# Patient Record
Sex: Male | Born: 1985
Health system: Southern US, Community
[De-identification: ages and names within clinical notes are randomized; demographics above are authoritative.]

## PROBLEM LIST (undated history)

## (undated) DIAGNOSIS — R142 Eructation: Principal | ICD-10-CM

## (undated) DIAGNOSIS — A630 Anogenital (venereal) warts: Secondary | ICD-10-CM

## (undated) DIAGNOSIS — R739 Hyperglycemia, unspecified: Secondary | ICD-10-CM

## (undated) DIAGNOSIS — I10 Essential (primary) hypertension: Secondary | ICD-10-CM

## (undated) DIAGNOSIS — B20 Human immunodeficiency virus [HIV] disease: Secondary | ICD-10-CM

## (undated) HISTORY — DX: Anogenital (venereal) warts: A63.0

## (undated) HISTORY — DX: Other disorders of bilirubin metabolism: E80.6

## (undated) HISTORY — DX: Essential (primary) hypertension: I10

## (undated) HISTORY — DX: Eructation: R14.2

## (undated) HISTORY — DX: Human immunodeficiency virus (HIV) disease: B20

## (undated) HISTORY — DX: Hyperglycemia, unspecified: R73.9

## (undated) HISTORY — PX: OTHER SURGICAL HISTORY: SHX169

---

## 2009-07-10 ENCOUNTER — Emergency Department (HOSPITAL_COMMUNITY): Admission: EM | Admit: 2009-07-10 | Discharge: 2009-07-10 | Payer: Self-pay | Admitting: Emergency Medicine

## 2010-01-03 LAB — CONVERTED CEMR LAB
Creatinine, Ser: 0.9 mg/dL
Triglyceride fasting, serum: 59 mg/dL
WBC: 8.9 10*3/uL

## 2010-01-10 LAB — CONVERTED CEMR LAB
CD4 Count: 832 microliters
CD4 T Helper %: 20.3 %

## 2010-01-31 ENCOUNTER — Ambulatory Visit: Payer: Self-pay | Admitting: Adult Health

## 2010-01-31 DIAGNOSIS — B20 Human immunodeficiency virus [HIV] disease: Secondary | ICD-10-CM | POA: Insufficient documentation

## 2010-01-31 LAB — CONVERTED CEMR LAB
ALT: 13 units/L (ref 0–53)
AST: 19 units/L (ref 0–37)
Albumin: 5.7 g/dL — ABNORMAL HIGH (ref 3.5–5.2)
Alkaline Phosphatase: 89 units/L (ref 39–117)
Basophils Absolute: 0 10*3/uL (ref 0.0–0.1)
Basophils Relative: 0 % (ref 0–1)
Bilirubin Urine: NEGATIVE
Calcium: 10.3 mg/dL (ref 8.4–10.5)
Chloride: 103 meq/L (ref 96–112)
Eosinophils Absolute: 0 10*3/uL (ref 0.0–0.7)
HCV Ab: NEGATIVE
HDL: 40 mg/dL (ref >39)
HIV 1 RNA Quant: 5630 copies/mL — ABNORMAL HIGH (ref <20)
HIV-1 RNA Quant, Log: 3.75 — ABNORMAL HIGH (ref <1.30)
Hep A Total Ab: NEGATIVE
Hep B Core Total Ab: NEGATIVE
Hep B S Ab: POSITIVE — AB
Hepatitis B Surface Ag: NEGATIVE
LDL Cholesterol: 99 mg/dL (ref 0–99)
MCHC: 33 g/dL (ref 30.0–36.0)
MCV: 85.3 fL (ref 78.0–100.0)
Monocytes Relative: 5 % (ref 3–12)
Neutro Abs: 10.1 10*3/uL — ABNORMAL HIGH (ref 1.7–7.7)
Neutrophils Relative %: 80 % — ABNORMAL HIGH (ref 43–77)
Platelets: 278 10*3/uL (ref 150–400)
Potassium: 3.9 meq/L (ref 3.5–5.3)
RBC: 5.79 M/uL (ref 4.22–5.81)
Sodium: 141 meq/L (ref 135–145)
Specific Gravity, Urine: 1.024 (ref 1.005–1.030)
Total Protein: 8.2 g/dL (ref 6.0–8.3)
Urobilinogen, UA: 0.2 (ref 0.0–1.0)
WBC: 12.8 10*3/uL — ABNORMAL HIGH (ref 4.0–10.5)
pH: 6 (ref 5.0–8.0)

## 2010-02-08 ENCOUNTER — Encounter: Admission: RE | Admit: 2010-02-08 | Discharge: 2010-02-08 | Payer: Self-pay | Admitting: Internal Medicine

## 2010-02-14 ENCOUNTER — Ambulatory Visit: Payer: Self-pay | Admitting: Adult Health

## 2010-02-14 DIAGNOSIS — A630 Anogenital (venereal) warts: Secondary | ICD-10-CM

## 2010-02-27 ENCOUNTER — Ambulatory Visit (HOSPITAL_BASED_OUTPATIENT_CLINIC_OR_DEPARTMENT_OTHER): Admission: RE | Admit: 2010-02-27 | Payer: Self-pay | Admitting: Surgery

## 2010-03-08 ENCOUNTER — Ambulatory Visit (HOSPITAL_COMMUNITY)
Admission: RE | Admit: 2010-03-08 | Discharge: 2010-03-08 | Payer: Self-pay | Source: Home / Self Care | Attending: Surgery | Admitting: Surgery

## 2010-03-22 ENCOUNTER — Encounter: Payer: Self-pay | Admitting: Adult Health

## 2010-04-07 ENCOUNTER — Encounter: Payer: Self-pay | Admitting: Adult Health

## 2010-04-25 ENCOUNTER — Emergency Department (HOSPITAL_COMMUNITY)
Admission: EM | Admit: 2010-04-25 | Discharge: 2010-04-25 | Payer: Self-pay | Source: Home / Self Care | Admitting: Emergency Medicine

## 2010-04-26 LAB — RPR: RPR Ser Ql: NONREACTIVE

## 2010-05-02 NOTE — Miscellaneous (Signed)
Summary: HIPAA Restrictions  HIPAA Restrictions   Imported By: Florinda Marker 02/01/2010 09:27:49  _____________________________________________________________________  External Attachment:    Type:   Image     Comment:   External Document

## 2010-05-02 NOTE — Assessment & Plan Note (Signed)
Summary: Nurse Visit (Infectious Disease)   Infectious Disease New Patient Intake Referring MD/Agency: Dr Juline Patch  Address: [address] [place], [place] [address]  Return Appointment Date: 02/14/2010  With Physician: Traci Sermon Medical Records: Received Health Insurance / Payor: Private Does insurance cover prescriptions? Yes Our patient has been informed that medication assistance programs are available.  Our Co-ordinator will be meeting with the patient during this visit to discuss financial and medication assistance.   Do you have a Primary physician: Yes Are family members aware of patient's diagnosis?  If so, are they supportive? Sister, supportive  Medical History Medication Allergies: No    Family History Hypertension: Yes  Family Side: Maternal  Comments: 58 living Diabetes: Yes  Family Side: Paternal  Comments: 47living  Tobacco Use: never  Behavioral Health Assessment Have you ever been diagnosed with depression or mental illness? No  Do you drink alcohol? Yes Last Date of Consumption: 01/31/2010 Frequency: social  Alcohol Beverage Type(s): wine (white)  Do you use recreational drugs? No Do you feel you have a problem with drugs and/or alcohol? No    HIV Intake Information When did you first test positive for HIV? 01/03/2010 Type of test Conducted: Western Blot   Where was this test performed?  Name of Agency: [facility]   City/State: [place], [place]  [place]: [place] Was this your first time ever being tested or HIV? No   LAST negative HIV test result: 05-2009 Name of Office[facility]   Risk Factor(s) for HIV: MSM  Method of Exposure to HIV: Homosexual Intercourse-Receptive Homosexual Intercourse-Insertive Have you ever been hospitalized for any HIV-related condition? No  Have you ever been under the care of a physician for being HIV positive? No  Newly Diagnosed Patients Has a Disease Intervention  Specialist from the Health Department contacted the patient? Yes.   The patient has been informed that the [facility] Department will contact ALL newly reported cases. Health Department Contact:  [phone]   (SSN is needed for confirmation)  Health Department Contact:  [phone]            (SSN is needed for confirmation)  Person Reporting: [facility] Do you have any Non-HIV related medical conditions or other prior hospitalizations or surgeries? No  HIV Medications Information The patient is currently NOT taking any HIV medications.  Infection History  Patient has been diagnosed with the following opportunistic infections: Are there any other symptoms you need to discuss? No Do you understand the meaning of a Viral Load? No Do you understand the meaning of a CD4 count? No Lab Values Education/Handout Given Yes Medication Education/Handout Given Yes  Sexual History Are you in a current relationship? No Details: Previous realtionship 5-6 months  Are you currently sexually active? No If no, when was your last encounter? 01/31/2009 Was this protected intercourse? No When was your last unprotected sex? 01/31/2010 Safe Sex Counseling/Pamphlet Given Sexual History Comments: last 6 months sexual  intercourse with 1 male (unprotected)  Last year 2 males (unprotected)  lifetime 4 males (unprotected)   Never had sex with females .   Evaluation and Follow-Up INTAKE CHECK LIST: HIV Education, Safe Sex Counseling, Case Management Referral, HIV Material Given, Juanell Fairly Consent  Prevention For Positives: 01/31/2010   Safe sex practices discussed with patient. Condoms offered. Current Allergies: No known allergies  Immunization History:  Influenza Immunization History:    Influenza:  historical (01/10/2010)  Immunizations Administered:  Pneumonia Vaccine:  Vaccine Type: Pneumovax    Site: right deltoid    Mfr: Merck    Dose: 0.5 ml    Route: IM    Given by: Tomasita Morrow RN     Exp. Date: 07/25/2011    Lot #: [id number]    VIS given: 03/07/09 version given January 31, 2010.  PPD Results    Date of reading: 07/01/2009    Results: < 5mm    Interpretation: negative PPD given at [facility].       Medication Adherence: 01/31/2010   Adherence to medications reviewed with patient. Counseling to provide adequate adherence provided    Prevention For Positives: 01/31/2010   Safe sex practices discussed with patient. Condoms offered.        01/31/2010   Patient was screened for substance abuse and depression. Referal was made as indicated.                       Depression History:      The patient denies a depressed mood most of the day and a diminished interest in his usual daily activities.        The patient denies that he feels like life is not worth living, denies that he wishes that he were dead, and denies that he has thought about ending his life.          -  Date:  01/10/2010    CD4: 832    CD4%: 20.3    Viral Load: 9490  Date:  01/03/2010    Glucose: 91    Hemoglobin: 15.8    WBC: 8.9    Creatinine: 0.9    Cholesterol: 177    Triglycerides: 59

## 2010-05-02 NOTE — Miscellaneous (Signed)
  Clinical Lists Changes  Orders: Added new Service order of New Patient Level IV (99204) - Signed 

## 2010-05-02 NOTE — Assessment & Plan Note (Signed)
Summary: [id number] intake   CC:  new pt. to establish and lab results.  History of Present Illness: 25 y/o African-american male newly diagnosed HIV in October 2011 in for initial evaluation post-intake.  He states he  has been feeling well and in normal state of good health.  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  He otherwise voices no complaints and has no current issues to address.  Allergies (verified): No Known Drug Allergies   Preventive Screening-Counseling & Management  Alcohol-Tobacco     Alcohol drinks/day: 0     Smoking Status: never  Caffeine-Diet-Exercise     Caffeine use/day: tea occasional     Does Patient Exercise: yes     Type of exercise: gym     Exercise (avg: min/session): 30-60     Times/week: <3  Safety-Violence-Falls     Seat Belt Use: yes      Sexual History:  same sex encounters.        Drug Use:  never.        Blood Transfusions:  no.        Travel History:  no.    Comments: pt. declined condoms   Current Allergies (reviewed today): No known allergies  Family History: Family History Diabetes 1st degree relative Family History Hypertension  Social History: Occupation: Engineer, drilling Single Never Smoked Alcohol use-no Drug use-no Regular exercise-yes Sexual History:  same sex encounters Drug Use:  never Education:  Postgraduate Marital Status:  Never Married Religion:  Christian Protestant Transportation:  Contractor Residence:  Renting Living Situation:  lives with family Sexually Active:  no Sexual Orientation:  homosexual Blood Transfusions:  no Travel History:  no  Additional History Condom Use:  frequently Hx of STD:  no Tattoos:  no School Level:  graduate school  Review of Systems General:  Denies chills, fatigue, fever, loss of appetite, malaise, sleep disorder, sweats, weakness, and weight loss. Eyes:  Denies  blurring, discharge, double vision, eye irritation, eye pain, halos, itching, light sensitivity, red eye, vision loss-1 eye, and vision loss-both eyes. ENT:  Denies decreased hearing, difficulty swallowing, ear discharge, earache, hoarseness, nasal congestion, nosebleeds, postnasal drainage, ringing in ears, sinus pressure, and sore throat. CV:  Denies bluish discoloration of lips or nails, chest pain or discomfort, difficulty breathing at night, difficulty breathing while lying down, fainting, fatigue, leg cramps with exertion, lightheadness, near fainting, palpitations, shortness of breath with exertion, swelling of feet, swelling of hands, and weight gain. Resp:  Denies chest discomfort, chest pain with inspiration, cough, coughing up blood, excessive snoring, hypersomnolence, morning headaches, pleuritic, shortness of breath, sputum productive, and wheezing. GI:  Denies abdominal pain, bloody stools, change in bowel habits, constipation, dark tarry stools, diarrhea, excessive appetite, gas, hemorrhoids, indigestion, loss of appetite, nausea, vomiting, vomiting blood, and yellowish skin color; Does have large rectal "wart" which is being assessed for surgical removal.. GU:  Denies decreased libido, discharge, dysuria, erectile dysfunction, genital sores, hematuria, incontinence, nocturia, urinary frequency, and urinary hesitancy. MS:  Denies joint pain, joint redness, joint swelling, loss of strength, low back pain, mid back pain, muscle aches, muscle , cramps, muscle weakness, stiffness, and thoracic pain. Derm:  Denies changes in color of skin, changes in nail beds, dryness, excessive perspiration, flushing, hair loss, insect bite(s), itching, lesion(s), poor wound healing, and rash. Neuro:  Denies brief paralysis, difficulty with concentration, disturbances in coordination, falling down, headaches, inability to  speak, memory loss, numbness, poor balance, seizures, sensation of room spinning, tingling,  tremors, visual disturbances, and weakness. Psych:  Denies alternate hallucination ( auditory/visual), anxiety, depression, easily angered, easily tearful, irritability, mental problems, panic attacks, sense of great danger, suicidal thoughts/plans, thoughts of violence, unusual visions or sounds, and thoughts /plans of harming others; States initially had some depressive thought, but claims a strong social network and family support and feels up to the challenge.. Endo:  Denies cold intolerance, excessive hunger, excessive thirst, excessive urination, heat intolerance, polyuria, and weight change. Heme:  Denies abnormal bruising, bleeding, enlarge lymph nodes, fevers, pallor, and skin discoloration. Allergy:  Denies hives or rash, itching eyes, persistent infections, seasonal allergies, and sneezing. Exposures:  Denies HIV exposure, EBV exposure, TB exposure, exposure to sick animals, exposure to sick people, exposure to unusual animals, exposure to small children, exposure to caves/spelunking, exposure to bats, exposure to hunting/wild game, exposure to stagnant or pond water, exposure to salt water, exposure to marine animals/shellfish, animal bites, cat scratches, tick bites, eating raw eggs, eating raw chicken, eating raw fish/shellfish, blood transfusion, ingestion of well water, water vapor exposure, history of needle use/puncture, history of antibiotic use (last 2 mo.), and history of travel.  Vital Signs:  Patient profile:   25 year old male Height:      68.5 inches (173.99 cm) Weight:      149.8 pounds (68.09 kg) BMI:     22.53 Temp:     98.2 degrees F (36.78 degrees C) oral Pulse rate:   89 / minute BP sitting:   147 / 76  (right arm)  Vitals Entered By: Wendall Mola CMA Duncan Dull) (February 14, 2010 9:03 AM) CC: new pt. to establish, lab results Is Patient Diabetic? No Pain Assessment Patient in pain? no      Nutritional Status BMI of 19 -24 = normal Nutritional Status  Detail appetite "normal"  Have you ever been in a relationship where you felt threatened, hurt or afraid?No   Does patient need assistance? Functional Status Self care Ambulation Normal   Physical Exam  General:  Well-developed,well-nourished,in no acute distress; alert,appropriate and cooperative throughout examination Head:  Normocephalic and atraumatic without obvious abnormalities. No apparent alopecia or balding. Eyes:  No corneal or conjunctival inflammation noted. EOMI. Perrla. Funduscopic exam benign, without hemorrhages, exudates or papilledema. Vision grossly normal. Ears:  External ear exam shows no significant lesions or deformities.  Otoscopic examination reveals clear canals, tympanic membranes are intact bilaterally without bulging, retraction, inflammation or discharge. Hearing is grossly normal bilaterally. Nose:  External nasal examination shows no deformity or inflammation. Nasal mucosa are pink and moist without lesions or exudates. Mouth:  Oral mucosa and oropharynx without lesions or exudates.  Teeth in good repair. Neck:  No deformities, masses, or tenderness noted. Chest Wall:  No deformities, masses, tenderness or gynecomastia noted. Lungs:  Normal respiratory effort, chest expands symmetrically. Lungs are clear to auscultation, no crackles or wheezes. Heart:  Normal rate and regular rhythm. S1 and S2 normal without gallop, murmur, click, rub or other extra sounds. Abdomen:  Bowel sounds positive,abdomen soft and non-tender without masses, organomegaly or hernias noted. Rectal:  Approx. 5 cm x 1cm x 3cm flat, verrucacious lesion noted in the perianal region.  Root of lesion indeterminate due to overall size.  No friability, no bleeding or associated pain noted. Genitalia:  Testes bilaterally descended without nodularity, tenderness or masses. No scrotal masses or lesions. No penis lesions or urethral discharge. Prostate:  deferred Msk:  No deformity or scoliosis noted  of thoracic or lumbar spine.   Pulses:  R and L carotid,radial,femoral,dorsalis pedis and posterior tibial pulses are full and equal bilaterally Extremities:  No clubbing, cyanosis, edema, or deformity noted with normal full range of motion of all joints.   Neurologic:  No cranial nerve deficits noted. Station and gait are normal. Plantar reflexes are down-going bilaterally. DTRs are symmetrical throughout. Sensory, motor and coordinative functions appear intact. Skin:  Intact without suspicious lesions or rashes Cervical Nodes:  No lymphadenopathy noted Axillary Nodes:  No palpable lymphadenopathy Inguinal Nodes:  No significant adenopathy Psych:  Cognition and judgment appear intact. Alert and cooperative with normal attention span and concentration. No apparent delusions, illusions, hallucinations   Impression & Recommendations:  Problem # 1:  HIV INFECTION (ICD-042) CD4 @ 470cells/mm3 @ 27%, baseline eval in 01/11/2010 832 @ 20.3%.  HIV RNA VL  was 9, 490 on 10/112/2011 and repeat was 5,630 copies/ml.  We spoke at length (>15 minutes) regarding implications of labs and standards for ARV therapy.  We also discussed multiple research options for him to participate.  He demonstrated interest in this and a referral to research was made.  We opted for research team to determine  fitness and suitability for studies and once a decision is made  we will follow up with him initially per their protocols.  If he does not fit studies available or declines enrollment we will see him 3 months to this date, or sooner if he opts for ARV therapy without research involvement.   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  We will defer that management to them, and from our perspective, he is medically cleared for surgery.  Post-operatively after wound healing and clearance from surgery, we will  perform an anal cytology specimen collection to check for cellular atypia.  Other Orders: Hepatitis A Vaccine (Adult Dose) [phone]) Admin 1st Vaccine ([id number])   Immunization History:  Influenza Immunization History:    Influenza:  historical (01/11/2010)  Immunizations Administered:  Hepatitis A Vaccine # 1:    Vaccine Type: HepA    Site: left deltoid    Mfr: GlaxoSmithKline    Dose: 0.5 ml    Route: IM    Given by: Wendall Mola CMA ( AAMA)    Exp. Date: 03/15/2012    Lot #: [id number]    VIS given: 06/20/04 version given February 14, 2010.

## 2010-05-04 NOTE — Miscellaneous (Signed)
Summary: Vaccine Record 01/10/10  Vaccine Record 01/10/10   Imported By: Florinda Marker 03/22/2010 16:33:32  _____________________________________________________________________  External Attachment:    Type:   Image     Comment:   External Document

## 2010-05-04 NOTE — Miscellaneous (Signed)
Summary: RW update  Clinical Lists Changes  Observations: Added new observation of RWPARTICIP: Yes (04/07/2010 13:56)

## 2010-05-04 NOTE — Consult Note (Signed)
Summary: New Pt. Referral: [facility]   New Pt. Referral: [facility]   Imported By: Florinda Marker 03/22/2010 16:32:13  _____________________________________________________________________  External Attachment:    Type:   Image     Comment:   External Document

## 2010-06-13 LAB — T-HELPER CELL (CD4) - (RCID CLINIC ONLY): CD4 % Helper T Cell: 27 % — ABNORMAL LOW (ref 33–55)

## 2010-06-13 LAB — SURGICAL PCR SCREEN
MRSA, PCR: NEGATIVE
Staphylococcus aureus: NEGATIVE

## 2010-06-13 LAB — CBC
MCH: 28.5 pg (ref 26.0–34.0)
MCHC: 33.7 g/dL (ref 30.0–36.0)
Platelets: 248 10*3/uL (ref 150–400)
RBC: 5.41 MIL/uL (ref 4.22–5.81)

## 2010-06-21 LAB — POCT I-STAT, CHEM 8
BUN: 4 mg/dL — ABNORMAL LOW (ref 6–23)
Calcium, Ion: 1.05 mmol/L — ABNORMAL LOW (ref 1.12–1.32)
Chloride: 103 mEq/L (ref 96–112)
Glucose, Bld: 104 mg/dL — ABNORMAL HIGH (ref 70–99)

## 2010-06-21 LAB — URINE MICROSCOPIC-ADD ON

## 2010-06-21 LAB — URINALYSIS, ROUTINE W REFLEX MICROSCOPIC
Bilirubin Urine: NEGATIVE
Glucose, UA: NEGATIVE mg/dL
Ketones, ur: 15 mg/dL — AB
pH: 7 (ref 5.0–8.0)

## 2010-11-08 ENCOUNTER — Other Ambulatory Visit: Payer: Self-pay | Admitting: Adult Health

## 2010-11-08 ENCOUNTER — Other Ambulatory Visit: Payer: Self-pay

## 2010-11-08 ENCOUNTER — Other Ambulatory Visit: Payer: Self-pay | Admitting: Infectious Disease

## 2010-11-08 ENCOUNTER — Other Ambulatory Visit (INDEPENDENT_AMBULATORY_CARE_PROVIDER_SITE_OTHER): Payer: 59

## 2010-11-08 DIAGNOSIS — Z113 Encounter for screening for infections with a predominantly sexual mode of transmission: Secondary | ICD-10-CM

## 2010-11-08 DIAGNOSIS — Z79899 Other long term (current) drug therapy: Secondary | ICD-10-CM

## 2010-11-08 DIAGNOSIS — B20 Human immunodeficiency virus [HIV] disease: Secondary | ICD-10-CM

## 2010-11-08 LAB — COMPLETE METABOLIC PANEL WITH GFR
ALT: 14 U/L (ref 0–53)
AST: 21 U/L (ref 0–37)
Albumin: 4.8 g/dL (ref 3.5–5.2)
Alkaline Phosphatase: 75 U/L (ref 39–117)
BUN: 10 mg/dL (ref 6–23)
Potassium: 3.8 mEq/L (ref 3.5–5.3)

## 2010-11-08 LAB — URINALYSIS, ROUTINE W REFLEX MICROSCOPIC
Glucose, UA: NEGATIVE mg/dL
Hgb urine dipstick: NEGATIVE
pH: 7 (ref 5.0–8.0)

## 2010-11-08 LAB — CBC WITH DIFFERENTIAL/PLATELET
Basophils Absolute: 0 10*3/uL (ref 0.0–0.1)
Basophils Relative: 0 % (ref 0–1)
HCT: 47.2 % (ref 39.0–52.0)
MCHC: 33.3 g/dL (ref 30.0–36.0)
Monocytes Absolute: 0.5 10*3/uL (ref 0.1–1.0)
Neutro Abs: 3.2 10*3/uL (ref 1.7–7.7)
Neutrophils Relative %: 52 % (ref 43–77)
RDW: 13.6 % (ref 11.5–15.5)

## 2010-11-08 LAB — URINALYSIS, MICROSCOPIC ONLY
Casts: NONE SEEN
Crystals: NONE SEEN
Squamous Epithelial / LPF: NONE SEEN

## 2010-11-08 LAB — RPR

## 2010-11-08 LAB — LIPID PANEL: Cholesterol: 172 mg/dL (ref 0–200)

## 2010-11-09 LAB — GC/CHLAMYDIA PROBE AMP, URINE
Chlamydia, Swab/Urine, PCR: NEGATIVE
GC Probe Amp, Urine: NEGATIVE

## 2010-11-09 LAB — T-HELPER CELL (CD4) - (RCID CLINIC ONLY): CD4 % Helper T Cell: 19 % — ABNORMAL LOW (ref 33–55)

## 2010-11-10 LAB — HIV-1 RNA QUANT-NO REFLEX-BLD
HIV 1 RNA Quant: 4090 copies/mL — ABNORMAL HIGH (ref <20)
HIV-1 RNA Quant, Log: 3.61 {Log} — ABNORMAL HIGH (ref <1.30)

## 2010-11-23 ENCOUNTER — Encounter: Payer: Self-pay | Admitting: Adult Health

## 2010-11-23 ENCOUNTER — Ambulatory Visit (INDEPENDENT_AMBULATORY_CARE_PROVIDER_SITE_OTHER): Payer: 59 | Admitting: Adult Health

## 2010-11-23 VITALS — BP 149/87 | HR 81 | Temp 98.4°F | Ht 68.0 in | Wt 159.0 lb

## 2010-11-23 DIAGNOSIS — B20 Human immunodeficiency virus [HIV] disease: Secondary | ICD-10-CM

## 2010-11-23 MED ORDER — EMTRICITAB-RILPIVIR-TENOFOV DF 200-25-300 MG PO TABS: 1.0000 | ORAL_TABLET | Freq: Every day | ORAL | Status: DC

## 2010-11-23 NOTE — Progress Notes (Signed)
Subjective:    Patient ID: Derek Fuentes. Paulino Door, male    DOB: 18-Feb-1986, 25 y.o.   MRN: [id number]  HPI Mr. Uzzle returns to clinic today for routine scheduled followup. He remains off antiretroviral medications and has not started any regimen since his diagnosis. The last time. He was seen in clinic was in November 2011. He denies any major illnesses, hospitalizations, traumas or injuries. States he's been feeling well in his normal state of good health.   Review of Systems  Constitutional: Negative for fever, chills, diaphoresis, activity change, appetite change, fatigue and unexpected weight change.  HENT: Negative for hearing loss, ear pain, nosebleeds, congestion, sore throat, facial swelling, rhinorrhea, sneezing, drooling, mouth sores, trouble swallowing, neck pain, neck stiffness, dental problem, voice change, postnasal drip, sinus pressure, tinnitus and ear discharge.   Eyes: Negative for photophobia, pain, discharge, redness, itching and visual disturbance.  Respiratory: Negative for apnea, cough, choking, chest tightness, shortness of breath, wheezing and stridor.   Cardiovascular: Negative for chest pain, palpitations and leg swelling.  Gastrointestinal: Negative for nausea, vomiting, abdominal pain, diarrhea, constipation, blood in stool, abdominal distention, anal bleeding and rectal pain.  Genitourinary: Negative for dysuria, urgency, frequency, hematuria, flank pain, decreased urine volume, discharge, penile swelling, scrotal swelling, enuresis, difficulty urinating, genital sores, penile pain and testicular pain.  Musculoskeletal: Negative for myalgias, back pain, joint swelling, arthralgias and gait problem.  Skin: Negative for color change, pallor, rash and wound.  Neurological: Negative for dizziness, tremors, seizures, syncope, facial asymmetry, speech difficulty, weakness, light-headedness, numbness and headaches.  Hematological: Negative for adenopathy. Does not  bruise/bleed easily.  Psychiatric/Behavioral: Negative for suicidal ideas, hallucinations, behavioral problems, confusion, sleep disturbance, self-injury, dysphoric mood, decreased concentration and agitation. The patient is not nervous/anxious and is not hyperactive.        Objective:   Physical Exam  Constitutional: He is oriented to person, place, and time. He appears well-developed and well-nourished.  HENT:  Head: Normocephalic and atraumatic.  Right Ear: External ear normal.  Left Ear: External ear normal.  Nose: Nose normal.  Mouth/Throat: Oropharynx is clear and moist.  Eyes: Conjunctivae and EOM are normal. Pupils are equal, round, and reactive to light. Right eye exhibits no discharge. Left eye exhibits no discharge.  Neck: Normal range of motion. Neck supple. No JVD present. No tracheal deviation present. No thyromegaly present.  Cardiovascular: Normal rate, regular rhythm, normal heart sounds and intact distal pulses.   Pulmonary/Chest: Effort normal and breath sounds normal. No stridor. He has no wheezes. He has no rales. He exhibits no tenderness.  Abdominal: Soft. Bowel sounds are normal. He exhibits no distension and no mass. There is no tenderness. There is no rebound and no guarding.  Genitourinary: Rectum normal and penis normal. Guaiac negative stool. No penile tenderness.  Musculoskeletal: Normal range of motion. He exhibits no edema and no tenderness.  Lymphadenopathy:    He has no cervical adenopathy.  Neurological: He is alert and oriented to person, place, and time. No cranial nerve deficit. He exhibits normal muscle tone. Coordination normal.  Skin: Skin is warm and dry. No rash noted. No erythema. No pallor.  Psychiatric: He has a normal mood and affect. His behavior is normal. Judgment and thought content normal.          Assessment & Plan:  1. HIV. Labs obtained 11/08/2010 show a CD4 count of 450 at 19% with a viral load of 4090 copies per mL. A review of  the serial. CD4 counts, demonstrated  continual and gradual decline in CD4 percent, over the past year and one half. We discussed in detail the implications of untreated HIV, inflammatory responses, and progressive. CD4 percent, declines, and advised on beginning antiretroviral therapy at this point. He verbally concurred, and stated he was already prepared to begin treatment. After review of the various treatment. Strategies, and potential regimens available, we decided on starting a regimen, with Complera one tablet by mouth daily with minimum of 400-calorie meal. Drug regimen, side effects, potential adverse drug reactions, and toxicities were discussed in detail. Written material regarding Complera was provided. He was instructed to return to clinic in 4 weeks for labs, with a followup with a provider in 6 weeks, but should he have problems between now, and that time. He should contact the clinic sooner for evaluation.  He verbally acknowledged all information that was provided to him and agreed with plan of care.

## 2011-01-05 ENCOUNTER — Other Ambulatory Visit (INDEPENDENT_AMBULATORY_CARE_PROVIDER_SITE_OTHER): Payer: 59

## 2011-01-05 ENCOUNTER — Other Ambulatory Visit: Payer: Self-pay | Admitting: Adult Health

## 2011-01-05 DIAGNOSIS — Z79899 Other long term (current) drug therapy: Secondary | ICD-10-CM

## 2011-01-05 DIAGNOSIS — Z113 Encounter for screening for infections with a predominantly sexual mode of transmission: Secondary | ICD-10-CM

## 2011-01-05 DIAGNOSIS — B20 Human immunodeficiency virus [HIV] disease: Secondary | ICD-10-CM

## 2011-01-05 LAB — CBC WITH DIFFERENTIAL/PLATELET
Basophils Relative: 0 % (ref 0–1)
Eosinophils Absolute: 0.1 10*3/uL (ref 0.0–0.7)
HCT: 47.6 % (ref 39.0–52.0)
Hemoglobin: 15.7 g/dL (ref 13.0–17.0)
Lymphs Abs: 3.8 10*3/uL (ref 0.7–4.0)
MCH: 28.6 pg (ref 26.0–34.0)
MCHC: 33 g/dL (ref 30.0–36.0)
Monocytes Absolute: 0.5 10*3/uL (ref 0.1–1.0)
Monocytes Relative: 6 % (ref 3–12)
Neutro Abs: 3.3 10*3/uL (ref 1.7–7.7)

## 2011-01-05 LAB — LIPID PANEL
HDL: 46 mg/dL (ref >39)
LDL Cholesterol: 121 mg/dL — ABNORMAL HIGH (ref 0–99)
Total CHOL/HDL Ratio: 3.9 Ratio
VLDL: 14 mg/dL (ref 0–40)

## 2011-01-05 LAB — COMPLETE METABOLIC PANEL WITH GFR
Albumin: 5 g/dL (ref 3.5–5.2)
Alkaline Phosphatase: 85 U/L (ref 39–117)
BUN: 12 mg/dL (ref 6–23)
CO2: 24 mEq/L (ref 19–32)
GFR, Est African American: >60 mL/min (ref >60)
GFR, Est Non African American: >60 mL/min (ref >60)
Glucose, Bld: 96 mg/dL (ref 70–99)
Potassium: 4 mEq/L (ref 3.5–5.3)

## 2011-01-05 LAB — T-HELPER CELL (CD4) - (RCID CLINIC ONLY)
CD4 % Helper T Cell: 17 % — ABNORMAL LOW (ref 33–55)
CD4 T Cell Abs: 720 uL (ref 400–2700)

## 2011-01-05 LAB — RPR

## 2011-01-06 LAB — URINALYSIS, ROUTINE W REFLEX MICROSCOPIC
Hgb urine dipstick: NEGATIVE
Ketones, ur: NEGATIVE mg/dL
Leukocytes, UA: NEGATIVE
Nitrite: NEGATIVE
Protein, ur: NEGATIVE mg/dL
Urobilinogen, UA: 0.2 mg/dL (ref 0.0–1.0)
pH: 7 (ref 5.0–8.0)

## 2011-01-19 ENCOUNTER — Encounter: Payer: Self-pay | Admitting: Infectious Disease

## 2011-01-19 ENCOUNTER — Ambulatory Visit (INDEPENDENT_AMBULATORY_CARE_PROVIDER_SITE_OTHER): Payer: 59 | Admitting: Infectious Disease

## 2011-01-19 VITALS — BP 139/89 | HR 88 | Temp 98.0°F | Ht 68.0 in | Wt 161.0 lb

## 2011-01-19 DIAGNOSIS — B20 Human immunodeficiency virus [HIV] disease: Secondary | ICD-10-CM

## 2011-01-19 DIAGNOSIS — A630 Anogenital (venereal) warts: Secondary | ICD-10-CM

## 2011-01-19 DIAGNOSIS — I10 Essential (primary) hypertension: Secondary | ICD-10-CM | POA: Insufficient documentation

## 2011-01-19 NOTE — Patient Instructions (Signed)
GREAT JOB!  Come in fasting for next set of labs

## 2011-01-19 NOTE — Assessment & Plan Note (Signed)
Superb control. 

## 2011-01-19 NOTE — Assessment & Plan Note (Signed)
Like to have him obtain a home blood pressure cuff to monitor this at home. I would consider also checking a microalbumin to creatinine ratio his next visit.

## 2011-01-19 NOTE — Progress Notes (Signed)
  Subjective:    Patient ID: Derek Fuentes, male    DOB: [birth year], 25 y.o.   MRN: [id number]  HPI  Derek Fuentes is a 25 y.o. male who is doing superbly well on his new antiviral regimen, complera with undetectable viral load and health cd4 count (700). He has no specific complaints today he is doing well he has not been slightly active. We reviewed how compression be taken with meals antacid should be avoided as well as proton pump inhibitors. We will review the caloric requirements for the medication. I reviewed with him the need for high level appearance to this non-nucleoside reverse transcriptase inhibitor regimen. Reviewed need for condom use with partners. Blood pressure was slightly elevated and we recommended him getting a home blood pressure cuff.   Review of Systems  Constitutional: Negative for fever, chills, diaphoresis, activity change, appetite change, fatigue and unexpected weight change.  HENT: Negative for congestion, sore throat, rhinorrhea, sneezing, trouble swallowing and sinus pressure.   Eyes: Negative for photophobia and visual disturbance.  Respiratory: Negative for cough, chest tightness, shortness of breath, wheezing and stridor.   Cardiovascular: Negative for chest pain, palpitations and leg swelling.  Gastrointestinal: Negative for nausea, vomiting, abdominal pain, diarrhea, constipation, blood in stool, abdominal distention and anal bleeding.  Genitourinary: Negative for dysuria, hematuria, flank pain and difficulty urinating.  Musculoskeletal: Negative for myalgias, back pain, joint swelling, arthralgias and gait problem.  Skin: Negative for color change, pallor, rash and wound.  Neurological: Negative for dizziness, tremors, weakness and light-headedness.  Hematological: Negative for adenopathy. Does not bruise/bleed easily.  Psychiatric/Behavioral: Negative for behavioral problems, confusion, sleep disturbance, dysphoric mood, decreased  concentration and agitation.       Objective:   Physical Exam  Constitutional: He is oriented to person, place, and time. He appears well-developed and well-nourished. No distress.  HENT:  Head: Normocephalic and atraumatic.  Mouth/Throat: Oropharynx is clear and moist. No oropharyngeal exudate.  Eyes: Conjunctivae and EOM are normal. Pupils are equal, round, and reactive to light. No scleral icterus.  Neck: Normal range of motion. Neck supple. No JVD present.  Cardiovascular: Normal rate, regular rhythm and normal heart sounds.  Exam reveals no gallop and no friction rub.   No murmur heard. Pulmonary/Chest: Effort normal and breath sounds normal. No respiratory distress. He has no wheezes. He has no rales. He exhibits no tenderness.  Abdominal: He exhibits no distension and no mass. There is no tenderness. There is no rebound and no guarding.  Musculoskeletal: He exhibits no edema and no tenderness.  Lymphadenopathy:    He has no cervical adenopathy.  Neurological: He is alert and oriented to person, place, and time. He has normal reflexes. He exhibits normal muscle tone. Coordination normal.  Skin: Skin is warm and dry. He is not diaphoretic. No erythema. No pallor.  Psychiatric: He has a normal mood and affect. His behavior is normal. Judgment and thought content normal.          Assessment & Plan:  HIV INFECTION Superb control  CONDYLOMA ACUMINATA, PERIANAL Status post surgery by [facility]  HTN (hypertension) Like to have him obtain a home blood pressure cuff to monitor this at home. I would consider also checking a microalbumin to creatinine ratio his next visit.

## 2011-01-19 NOTE — Assessment & Plan Note (Signed)
Status post surgery by [facility]

## 2011-05-10 ENCOUNTER — Other Ambulatory Visit: Payer: 59

## 2011-05-10 DIAGNOSIS — B20 Human immunodeficiency virus [HIV] disease: Secondary | ICD-10-CM

## 2011-05-10 LAB — COMPLETE METABOLIC PANEL WITH GFR
Albumin: 5.1 g/dL (ref 3.5–5.2)
CO2: 24 mEq/L (ref 19–32)
Calcium: 10.1 mg/dL (ref 8.4–10.5)
Chloride: 103 mEq/L (ref 96–112)
GFR, Est African American: >89 mL/min
GFR, Est Non African American: >89 mL/min
Glucose, Bld: 110 mg/dL — ABNORMAL HIGH (ref 70–99)
Potassium: 3.7 mEq/L (ref 3.5–5.3)
Sodium: 139 mEq/L (ref 135–145)
Total Bilirubin: 2 mg/dL — ABNORMAL HIGH (ref 0.3–1.2)
Total Protein: 7.8 g/dL (ref 6.0–8.3)

## 2011-05-10 LAB — CBC WITH DIFFERENTIAL/PLATELET
Basophils Absolute: 0 10*3/uL (ref 0.0–0.1)
Eosinophils Relative: 1 % (ref 0–5)
HCT: 47.6 % (ref 39.0–52.0)
Lymphocytes Relative: 34 % (ref 12–46)
MCV: 87.7 fL (ref 78.0–100.0)
Monocytes Absolute: 0.5 10*3/uL (ref 0.1–1.0)
RDW: 13.5 % (ref 11.5–15.5)
WBC: 8.1 10*3/uL (ref 4.0–10.5)

## 2011-05-10 LAB — LIPID PANEL
Cholesterol: 171 mg/dL (ref 0–200)
Triglycerides: 41 mg/dL (ref <150)
VLDL: 8 mg/dL (ref 0–40)

## 2011-05-11 LAB — RPR

## 2011-05-11 LAB — T-HELPER CELL (CD4) - (RCID CLINIC ONLY)
CD4 % Helper T Cell: 20 % — ABNORMAL LOW (ref 33–55)
CD4 T Cell Abs: 600 uL (ref 400–2700)

## 2011-05-11 LAB — GC/CHLAMYDIA PROBE AMP, URINE: GC Probe Amp, Urine: NEGATIVE

## 2011-05-24 ENCOUNTER — Encounter: Payer: Self-pay | Admitting: Adult Health

## 2011-05-24 ENCOUNTER — Ambulatory Visit (INDEPENDENT_AMBULATORY_CARE_PROVIDER_SITE_OTHER): Payer: 59 | Admitting: Adult Health

## 2011-05-24 DIAGNOSIS — I10 Essential (primary) hypertension: Secondary | ICD-10-CM

## 2011-05-24 DIAGNOSIS — B20 Human immunodeficiency virus [HIV] disease: Secondary | ICD-10-CM

## 2011-05-24 DIAGNOSIS — Z23 Encounter for immunization: Secondary | ICD-10-CM

## 2011-05-24 DIAGNOSIS — J069 Acute upper respiratory infection, unspecified: Secondary | ICD-10-CM | POA: Insufficient documentation

## 2011-05-24 DIAGNOSIS — Z0000 Encounter for general adult medical examination without abnormal findings: Secondary | ICD-10-CM | POA: Insufficient documentation

## 2011-05-24 NOTE — Assessment & Plan Note (Signed)
He still claims that his blood pressure normally is between 130/80, and 130/70. We encouraged him to keep a log and show [place]. These values as are recent assessments indicate that he has sustained elevation in blood pressure. We will readdress this again on his next visit.

## 2011-05-24 NOTE — Progress Notes (Signed)
Subjective:    Patient ID: Derek Fuentes is a 26 y.o. male.  Chief Complaint: HIV Follow-up Visit Derek Fuentes is here for follow-up of HIV infection. He is feeling unchanged since his last visit.  He claims continued adherence to therapy with good tolerance and no complications. There are additional complaints.   Patient complains of 5 days of coryza, congestion, post nasal drip and headache.     Data Review: Diagnostic studies reviewed.  Review of Systems - General ROS: positive for  - fatigue Psychological ROS: negative Respiratory ROS: positive for - cough Cardiovascular ROS: no chest pain or dyspnea on exertion Gastrointestinal ROS: no abdominal pain, change in bowel habits, or black or bloody stools Genito-Urinary ROS: no dysuria, trouble voiding, or hematuria Musculoskeletal ROS: negative Neurological ROS: no TIA or stroke symptoms Dermatological ROS: negative  Objective:   General appearance: alert, cooperative and no distress Head: Normocephalic, without obvious abnormality, atraumatic Eyes: conjunctivae/corneas clear. PERRL, EOM's intact. Fundi benign. Ears: Slight TM, bulging bilaterally Nose: Some mucosal irritation in the nares noted Throat: lips, mucosa, and tongue normal; teeth and gums normal and Postnasal drip noted Resp: clear to auscultation bilaterally Cardio: regular rate and rhythm, S1, S2 normal, no murmur, click, rub or gallop GI: soft, non-tender; bowel sounds normal; no masses,  no organomegaly Extremities: extremities normal, atraumatic, no cyanosis or edema Skin: Skin color, texture, turgor normal. No rashes or lesions Neurologic: Alert and oriented X 3, normal strength and tone. Normal symmetric reflexes. Normal coordination and gait Psych:  No vegetative signs or delusional behaviors noted.    Laboratory:  HIV 1 RNA Quant (copies/mL)  Date Value  05/10/2011 <20   01/05/2011 <20   11/08/2010 4090*     CD4 T Cell Abs (cmm)    Date Value  05/10/2011 600   01/05/2011 720   11/08/2010 450      CD4 % Helper T Cell (%)  Date Value  05/10/2011 20*  01/05/2011 17*  11/08/2010 19*     Hep A Total Ab (no units)  Date Value  01/31/2010 NEG      Hep B S Ab (no units)  Date Value  01/31/2010 POS*     Hepatitis B Surface Ag (no units)  Date Value  01/31/2010 NEG      HCV Ab (no units)  Date Value  01/31/2010 NEG           Assessment/Plan:   URI (upper respiratory infection) Guaifenesin/Dextromethorphan15 mL every 4 hours when necessary  Ibuprofen. 600 mg every 6-8 hours when necessary , fever, and discomfort  Diphenhydramine.25-50 mg by mouth each bedtime when necessary  Cetirizine 10 mg by mouth every morning Bed rest for at least the next 2 days. Increase fluid intake. Warm packs over sinuses if needed. Proper nutrition with a least 3 meals a day. Contact clinic or go to urgent care. If symptoms worsen over the next 5 days or do not improve in the next 7 days.  HIV INFECTION Clinically stable on current regimen. Continue present management.  Counseling provided on prevention of transmission of HIV. Condoms offered:  Yes Medication adherence discussed with patient. Medication refills ordered as needed. Referrals: none Follow up visit in 6 months with labs 2 weeks prior to appointment. Patient verbally acknowledged information provided to them and agreed with plan of care.   Routine health maintenance Hep A Series never completed.  Re-start series today.  HTN (hypertension) He still claims that his blood  pressure normally is between 130/80, and 130/70. We encouraged him to keep a log and show [place]. These values as are recent assessments indicate that he has sustained elevation in blood pressure. We will readdress this again on his next visit.      Sumaiyah Markert A. Sundra Aland, MS, [facility] [phone]  05/24/2011, 10:13 AM

## 2011-05-24 NOTE — Assessment & Plan Note (Signed)
Hep A Series never completed.  Re-start series today.

## 2011-05-24 NOTE — Patient Instructions (Signed)
Guaifenesin/Dextromethorphan (Robitussin-DM or equivalent) 15 mL every 4 hours when necessary   Ibuprofen (Motrin/Advil or equivalent). 600 mg every 6-8 hours when necessary , fever, and discomfort   Diphenhydramine (Benadryl) .25-50 mg by mouth each bedtime when necessary   Cetirizine (Zyrtec) 10 mg by mouth every morning  Bed rest for at least the next 2 days. Increase fluid intake. Warm packs over sinuses if needed. Proper nutrition with a least 3 meals a day.  Contact clinic or go to urgent care. If symptoms worsen over the next 5 days or do not improve in the next 7 days.

## 2011-05-24 NOTE — Assessment & Plan Note (Addendum)
Clinically stable on current regimen. Continue present management.  Counseling provided on prevention of transmission of HIV. Condoms offered:  Yes Medication adherence discussed with patient. Medication refills ordered as needed. Referrals: none Follow up visit in 6 months with labs 2 weeks prior to appointment. Patient verbally acknowledged information provided to them and agreed with plan of care.

## 2011-05-24 NOTE — Assessment & Plan Note (Signed)
Guaifenesin/Dextromethorphan15 mL every 4 hours when necessary  Ibuprofen. 600 mg every 6-8 hours when necessary , fever, and discomfort  Diphenhydramine.25-50 mg by mouth each bedtime when necessary  Cetirizine 10 mg by mouth every morning Bed rest for at least the next 2 days. Increase fluid intake. Warm packs over sinuses if needed. Proper nutrition with a least 3 meals a day. Contact clinic or go to urgent care. If symptoms worsen over the next 5 days or do not improve in the next 7 days.  

## 2011-11-06 ENCOUNTER — Other Ambulatory Visit: Payer: Self-pay | Admitting: Licensed Clinical Social Worker

## 2011-11-06 DIAGNOSIS — B20 Human immunodeficiency virus [HIV] disease: Secondary | ICD-10-CM

## 2011-11-06 MED ORDER — EMTRICITAB-RILPIVIR-TENOFOV DF 200-25-300 MG PO TABS: 1.0000 | ORAL_TABLET | Freq: Every day | ORAL | Status: DC

## 2011-11-28 ENCOUNTER — Other Ambulatory Visit: Payer: Self-pay | Admitting: Infectious Disease

## 2011-11-28 DIAGNOSIS — B20 Human immunodeficiency virus [HIV] disease: Secondary | ICD-10-CM

## 2011-12-06 ENCOUNTER — Other Ambulatory Visit: Payer: 59

## 2011-12-06 DIAGNOSIS — B20 Human immunodeficiency virus [HIV] disease: Secondary | ICD-10-CM

## 2011-12-06 LAB — CBC WITH DIFFERENTIAL/PLATELET
Hemoglobin: 15.7 g/dL (ref 13.0–17.0)
Lymphs Abs: 3.4 10*3/uL (ref 0.7–4.0)
Monocytes Relative: 5 % (ref 3–12)
Neutro Abs: 4.8 10*3/uL (ref 1.7–7.7)
Neutrophils Relative %: 55 % (ref 43–77)
Platelets: 286 10*3/uL (ref 150–400)
RBC: 5.39 MIL/uL (ref 4.22–5.81)
WBC: 8.7 10*3/uL (ref 4.0–10.5)

## 2011-12-06 LAB — COMPREHENSIVE METABOLIC PANEL
ALT: 70 U/L — ABNORMAL HIGH (ref 0–53)
Albumin: 4.8 g/dL (ref 3.5–5.2)
Alkaline Phosphatase: 94 U/L (ref 39–117)
CO2: 22 mEq/L (ref 19–32)
Glucose, Bld: 93 mg/dL (ref 70–99)
Potassium: 4.2 mEq/L (ref 3.5–5.3)
Sodium: 141 mEq/L (ref 135–145)
Total Protein: 7.4 g/dL (ref 6.0–8.3)

## 2011-12-07 LAB — T-HELPER CELL (CD4) - (RCID CLINIC ONLY)
CD4 % Helper T Cell: 20 % — ABNORMAL LOW (ref 33–55)
CD4 T Cell Abs: 640 uL (ref 400–2700)

## 2011-12-10 LAB — HIV-1 RNA QUANT-NO REFLEX-BLD
HIV 1 RNA Quant: <20 copies/mL (ref <20)
HIV-1 RNA Quant, Log: <1.3 {Log} (ref <1.30)

## 2011-12-19 ENCOUNTER — Encounter: Payer: Self-pay | Admitting: Infectious Disease

## 2011-12-19 ENCOUNTER — Ambulatory Visit (INDEPENDENT_AMBULATORY_CARE_PROVIDER_SITE_OTHER): Payer: 59 | Admitting: Infectious Disease

## 2011-12-19 VITALS — BP 131/86 | HR 111 | Temp 98.6°F | Ht 68.0 in | Wt 159.5 lb

## 2011-12-19 DIAGNOSIS — I10 Essential (primary) hypertension: Secondary | ICD-10-CM

## 2011-12-19 DIAGNOSIS — Z113 Encounter for screening for infections with a predominantly sexual mode of transmission: Secondary | ICD-10-CM

## 2011-12-19 DIAGNOSIS — B20 Human immunodeficiency virus [HIV] disease: Secondary | ICD-10-CM

## 2011-12-19 NOTE — Progress Notes (Signed)
  Subjective:    Patient ID: Derek Fuentes, male    DOB: 1985/09/27, 26 y.o.   MRN: [id number]  HPI  Derek Fuentes is a 26 y.o. male who is doing superbly well on his new antiviral regimen, complera with undetectable viral load and health cd4 count. Been concerned about his blood pressure being elevated. At home his blood pressure systolics have run in the 130s at times. I recommended we recheck a microalbumin to creatinine ratio today and that he continue diet and exercise. It is micrograms creatinine is elevated we will institute an ACE inhibitor. He was flu vaccine as part of his employer mandated vaccination.   Review of Systems  Constitutional: Negative for fever, chills, diaphoresis, activity change, appetite change, fatigue and unexpected weight change.  HENT: Negative for congestion, sore throat, rhinorrhea, sneezing, trouble swallowing and sinus pressure.   Eyes: Negative for photophobia and visual disturbance.  Respiratory: Negative for cough, chest tightness, shortness of breath, wheezing and stridor.   Cardiovascular: Negative for chest pain, palpitations and leg swelling.  Gastrointestinal: Negative for nausea, vomiting, abdominal pain, diarrhea, constipation, blood in stool, abdominal distention and anal bleeding.  Genitourinary: Negative for dysuria, hematuria, flank pain and difficulty urinating.  Musculoskeletal: Negative for myalgias, back pain, joint swelling, arthralgias and gait problem.  Skin: Negative for color change, pallor, rash and wound.  Neurological: Negative for dizziness, tremors, weakness and light-headedness.  Hematological: Negative for adenopathy. Does not bruise/bleed easily.  Psychiatric/Behavioral: Negative for behavioral problems, confusion, disturbed wake/sleep cycle, dysphoric mood, decreased concentration and agitation.       Objective:   Physical Exam  Constitutional: He is oriented to person, place, and time. He appears  well-developed and well-nourished. No distress.  HENT:  Head: Normocephalic and atraumatic.  Mouth/Throat: Oropharynx is clear and moist. No oropharyngeal exudate.  Eyes: Conjunctivae normal and EOM are normal. Pupils are equal, round, and reactive to light. No scleral icterus.  Neck: Normal range of motion. Neck supple. No JVD present.  Cardiovascular: Normal rate, regular rhythm and normal heart sounds.  Exam reveals no gallop and no friction rub.   No murmur heard. Pulmonary/Chest: Effort normal and breath sounds normal. No respiratory distress. He has no wheezes. He has no rales. He exhibits no tenderness.  Abdominal: He exhibits no distension and no mass. There is no tenderness. There is no rebound and no guarding.  Musculoskeletal: He exhibits no edema and no tenderness.  Lymphadenopathy:    He has no cervical adenopathy.  Neurological: He is alert and oriented to person, place, and time. He has normal reflexes. He exhibits normal muscle tone. Coordination normal.  Skin: Skin is warm and dry. He is not diaphoretic. No erythema. No pallor.  Psychiatric: He has a normal mood and affect. His behavior is normal. Judgment and thought content normal.          Assessment & Plan:  HIV INFECTION Continue complera perfect control  HTN (hypertension) Check microalbumin to creatinine ratio.

## 2011-12-19 NOTE — Assessment & Plan Note (Signed)
Continue complera perfect control

## 2011-12-19 NOTE — Assessment & Plan Note (Signed)
Check microalbumin to creatinine ratio.  

## 2012-02-19 ENCOUNTER — Other Ambulatory Visit: Payer: Self-pay | Admitting: Licensed Clinical Social Worker

## 2012-02-19 DIAGNOSIS — B20 Human immunodeficiency virus [HIV] disease: Secondary | ICD-10-CM

## 2012-02-19 MED ORDER — EMTRICITAB-RILPIVIR-TENOFOV DF 200-25-300 MG PO TABS: 1.0000 | ORAL_TABLET | Freq: Every day | ORAL | Status: DC

## 2012-05-13 ENCOUNTER — Other Ambulatory Visit: Payer: Self-pay | Admitting: Licensed Clinical Social Worker

## 2012-05-13 DIAGNOSIS — B20 Human immunodeficiency virus [HIV] disease: Secondary | ICD-10-CM

## 2012-05-13 MED ORDER — EMTRICITAB-RILPIVIR-TENOFOV DF 200-25-300 MG PO TABS: 1.0000 | ORAL_TABLET | Freq: Every day | ORAL | Status: DC

## 2012-06-09 ENCOUNTER — Other Ambulatory Visit (INDEPENDENT_AMBULATORY_CARE_PROVIDER_SITE_OTHER): Payer: 59

## 2012-06-09 DIAGNOSIS — B20 Human immunodeficiency virus [HIV] disease: Secondary | ICD-10-CM

## 2012-06-09 LAB — CBC WITH DIFFERENTIAL/PLATELET
Basophils Absolute: 0 K/uL (ref 0.0–0.1)
Basophils Relative: 0 % (ref 0–1)
Eosinophils Absolute: 0.1 K/uL (ref 0.0–0.7)
Eosinophils Relative: 1 % (ref 0–5)
HCT: 45.3 % (ref 39.0–52.0)
Hemoglobin: 15.6 g/dL (ref 13.0–17.0)
Lymphocytes Relative: 49 % — ABNORMAL HIGH (ref 12–46)
Lymphs Abs: 3.6 K/uL (ref 0.7–4.0)
MCH: 29.1 pg (ref 26.0–34.0)
MCHC: 34.4 g/dL (ref 30.0–36.0)
MCV: 84.4 fL (ref 78.0–100.0)
Monocytes Absolute: 0.4 K/uL (ref 0.1–1.0)
Monocytes Relative: 5 % (ref 3–12)
Neutro Abs: 3.3 K/uL (ref 1.7–7.7)
Neutrophils Relative %: 45 % (ref 43–77)
Platelets: 254 K/uL (ref 150–400)
RBC: 5.37 MIL/uL (ref 4.22–5.81)
RDW: 14 % (ref 11.5–15.5)
WBC: 7.4 K/uL (ref 4.0–10.5)

## 2012-06-10 LAB — COMPLETE METABOLIC PANEL WITH GFR
ALT: 19 U/L (ref 0–53)
AST: 25 U/L (ref 0–37)
BUN: 13 mg/dL (ref 6–23)
Creat: 1.01 mg/dL (ref 0.50–1.35)
Total Bilirubin: 1.4 mg/dL — ABNORMAL HIGH (ref 0.3–1.2)

## 2012-06-10 LAB — HIV-1 RNA QUANT-NO REFLEX-BLD
HIV 1 RNA Quant: <20 copies/mL (ref <20)
HIV-1 RNA Quant, Log: <1.3 {Log} (ref <1.30)

## 2012-06-10 LAB — T-HELPER CELL (CD4) - (RCID CLINIC ONLY): CD4 % Helper T Cell: 20 % — ABNORMAL LOW (ref 33–55)

## 2012-06-10 LAB — LIPID PANEL
Cholesterol: 159 mg/dL (ref 0–200)
HDL: 38 mg/dL — ABNORMAL LOW (ref >39)
LDL Cholesterol: 111 mg/dL — ABNORMAL HIGH (ref 0–99)
Triglycerides: 52 mg/dL (ref <150)
VLDL: 10 mg/dL (ref 0–40)

## 2012-06-10 LAB — RPR

## 2012-06-23 ENCOUNTER — Encounter: Payer: Self-pay | Admitting: Infectious Disease

## 2012-06-23 ENCOUNTER — Ambulatory Visit (INDEPENDENT_AMBULATORY_CARE_PROVIDER_SITE_OTHER): Payer: 59 | Admitting: Infectious Disease

## 2012-06-23 VITALS — BP 146/86 | HR 102 | Temp 97.7°F | Ht 68.0 in | Wt 165.0 lb

## 2012-06-23 DIAGNOSIS — I10 Essential (primary) hypertension: Secondary | ICD-10-CM

## 2012-06-23 DIAGNOSIS — B20 Human immunodeficiency virus [HIV] disease: Secondary | ICD-10-CM

## 2012-06-23 LAB — MICROALBUMIN / CREATININE URINE RATIO: Microalb Creat Ratio: 5 mg/g (ref 0.0–30.0)

## 2012-06-23 NOTE — Progress Notes (Signed)
  Subjective:    Patient ID: Derek Fuentes, male    DOB: 03-12-1986, 27 y.o.   MRN: [id number]  HPI   Derek Fuentes is a 27 y.o. male who is doing superbly well on hisantiviral regimen, complera with undetectable viral load and health cd4 count.  Again we reviewed concerns about his blood pressure being elevated. He is checking bp at home but I have asked him to also keep a log of bp for [place]   Review of Systems  Constitutional: Negative for fever, chills, diaphoresis, activity change, appetite change, fatigue and unexpected weight change.  HENT: Negative for congestion, sore throat, rhinorrhea, sneezing, trouble swallowing and sinus pressure.   Eyes: Negative for photophobia and visual disturbance.  Respiratory: Negative for cough, chest tightness, shortness of breath, wheezing and stridor.   Cardiovascular: Negative for chest pain, palpitations and leg swelling.  Gastrointestinal: Negative for nausea, vomiting, abdominal pain, diarrhea, constipation, blood in stool, abdominal distention and anal bleeding.  Genitourinary: Negative for dysuria, hematuria, flank pain and difficulty urinating.  Musculoskeletal: Negative for myalgias, back pain, joint swelling, arthralgias and gait problem.  Skin: Negative for color change, pallor, rash and wound.  Neurological: Negative for dizziness, tremors, weakness and light-headedness.  Hematological: Negative for adenopathy. Does not bruise/bleed easily.  Psychiatric/Behavioral: Negative for behavioral problems, confusion, sleep disturbance, dysphoric mood, decreased concentration and agitation.       Objective:   Physical Exam  Constitutional: He is oriented to person, place, and time. He appears well-developed and well-nourished. No distress.  HENT:  Head: Normocephalic and atraumatic.  Mouth/Throat: Oropharynx is clear and moist. No oropharyngeal exudate.  Eyes: Conjunctivae and EOM are normal. Pupils are equal, round, and reactive  to light. No scleral icterus.  Neck: Normal range of motion. Neck supple. No JVD present.  Cardiovascular: Normal rate, regular rhythm and normal heart sounds.  Exam reveals no gallop and no friction rub.   No murmur heard. Pulmonary/Chest: Effort normal and breath sounds normal. No respiratory distress. He has no wheezes. He has no rales. He exhibits no tenderness.  Abdominal: He exhibits no distension and no mass. There is no tenderness. There is no rebound and no guarding.  Musculoskeletal: He exhibits no edema and no tenderness.  Lymphadenopathy:    He has no cervical adenopathy.  Neurological: He is alert and oriented to person, place, and time. He has normal reflexes. He exhibits normal muscle tone. Coordination normal.  Skin: Skin is warm and dry. He is not diaphoretic. No erythema. No pallor.  Psychiatric: He has a normal mood and affect. His behavior is normal. Judgment and thought content normal.          Assessment & Plan:  HIV: continue Complera  HTN: recheck microalbumin to creatinine ratio, home bp log

## 2012-07-15 IMAGING — CT CT ABD-PELV W/ CM
2 of 4 series · 10 of 36 positions shown, 17 images · IV contrast (agent unspecified)
Comparison: None.

CLINICAL DATA: Rectal mass, HIV positive

CT ABDOMEN AND PELVIS WITH CONTRAST
TECHNIQUE: Multidetector CT imaging of the abdomen and pelvis was
performed following the standard protocol during bolus
administration of intravenous contrast.
Contrast: 100 ml [id number]

[Series 3: routine abdomen · axial · 0.66mm/px · z∈[-323,-3]mm · 9 of 81 slices shown, 15 images]
[im 9/81  soft-tissue]
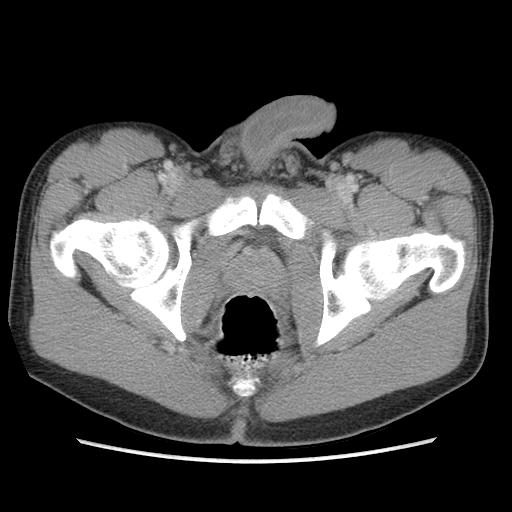
[im 9/81  bone]
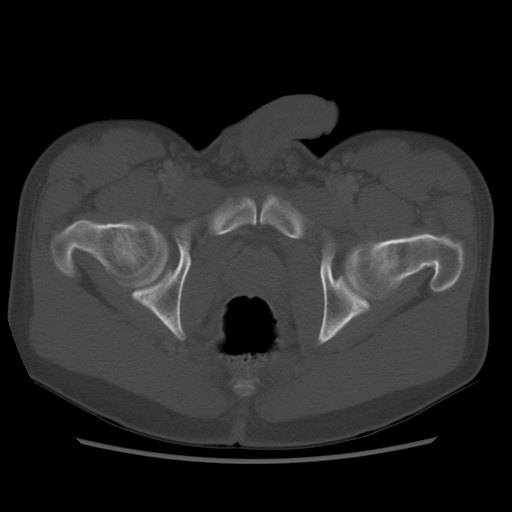
[im 17/81  soft-tissue]
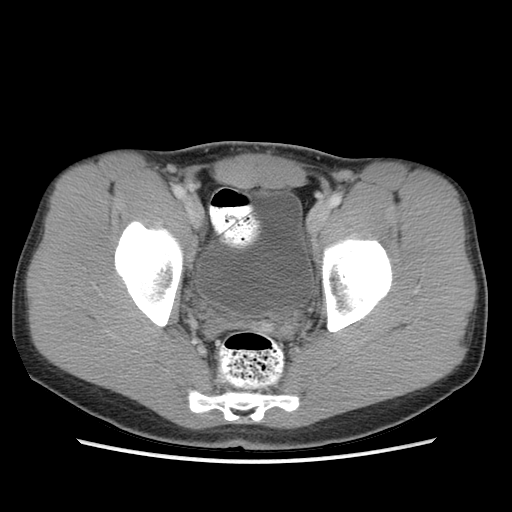
[im 25/81  soft-tissue]
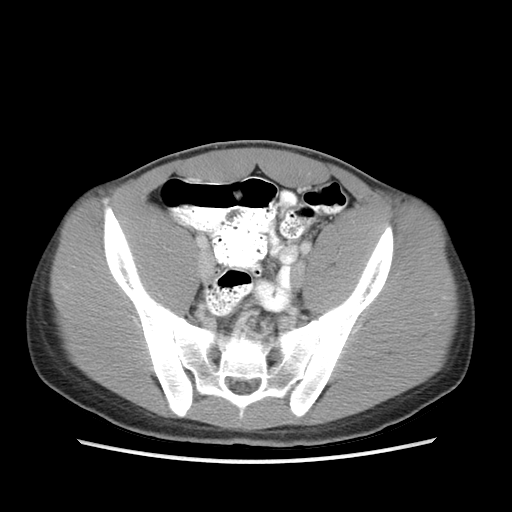
[im 33/81  soft-tissue]
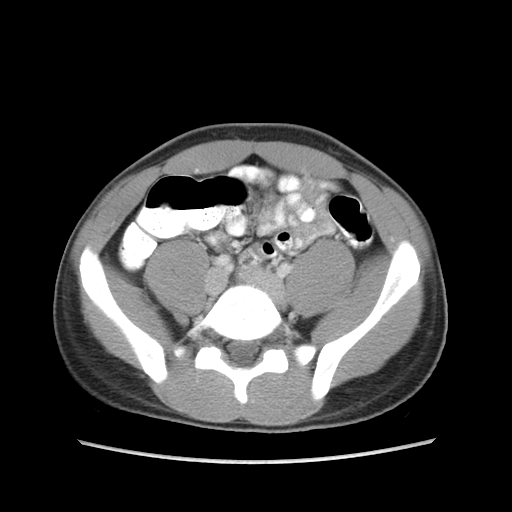
[im 41/81  soft-tissue]
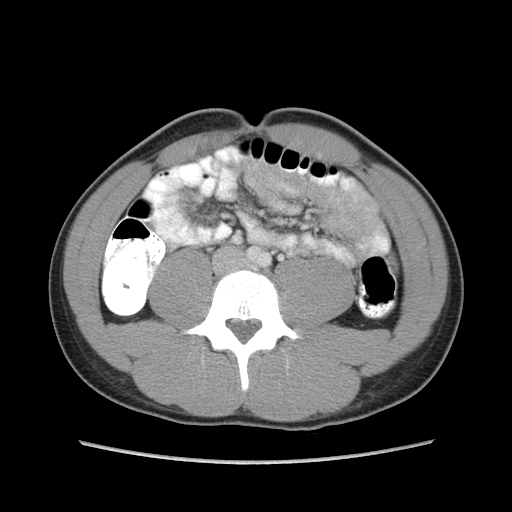
[im 49/81  soft-tissue]
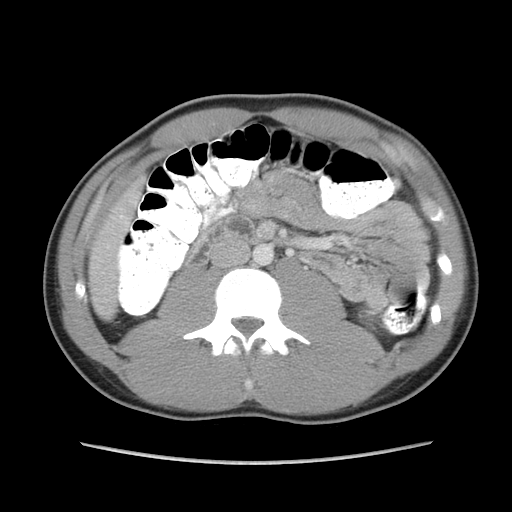
[im 49/81  lung]
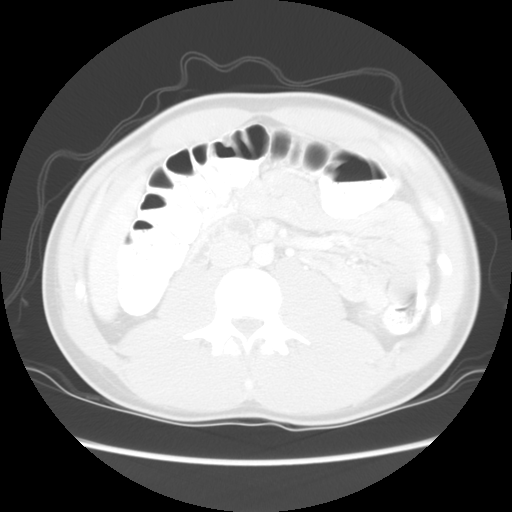
[im 57/81  soft-tissue]
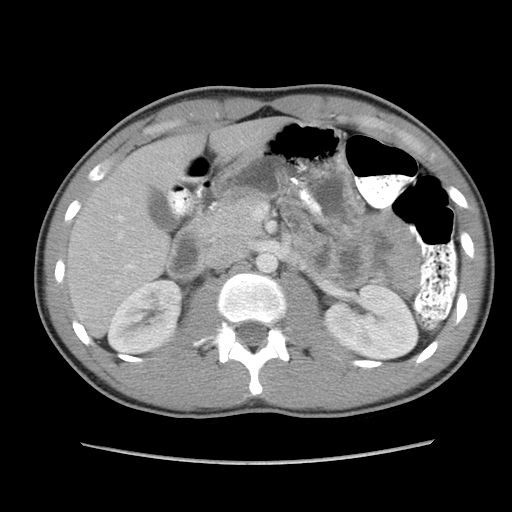
[im 57/81  lung]
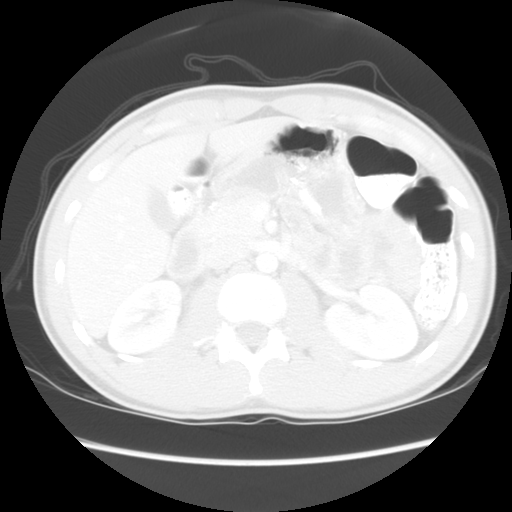
[im 65/81  soft-tissue]
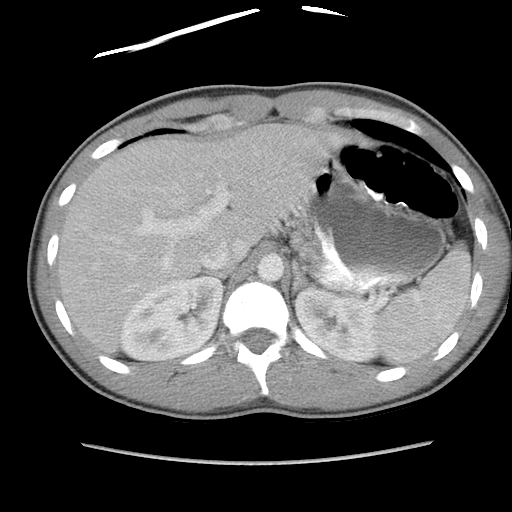
[im 65/81  lung]
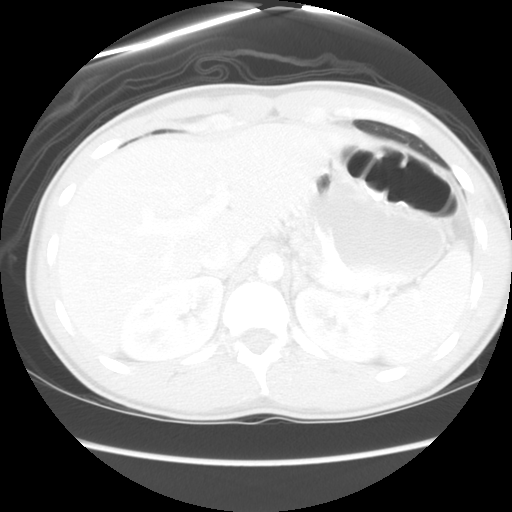
[im 73/81  soft-tissue]
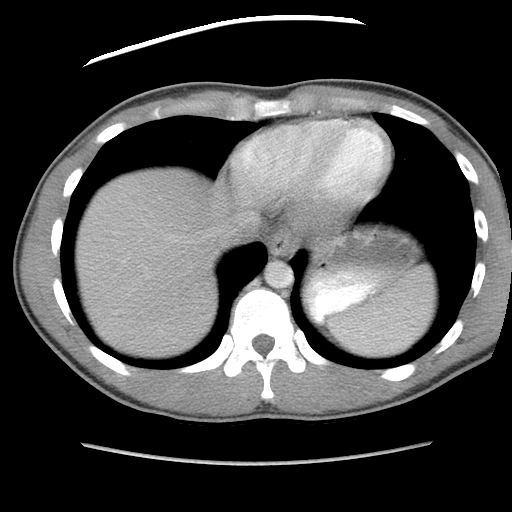
[im 73/81  lung]
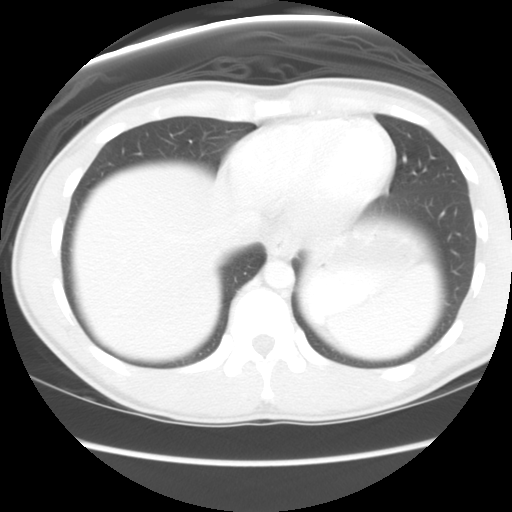
[im 73/81  bone]
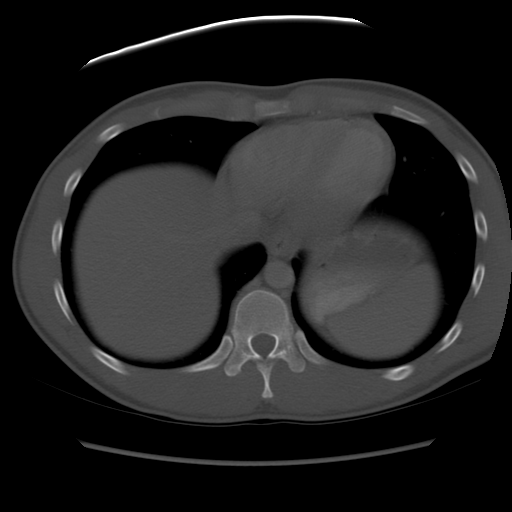

[Series 601: coronal body · coronal · 0.83mm/px · 1 of 106 slices shown, 2 images]
[im 36/106  soft-tissue]
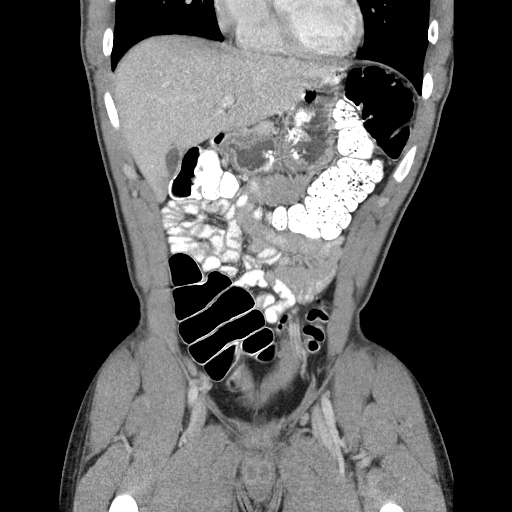
[im 36/106  bone]
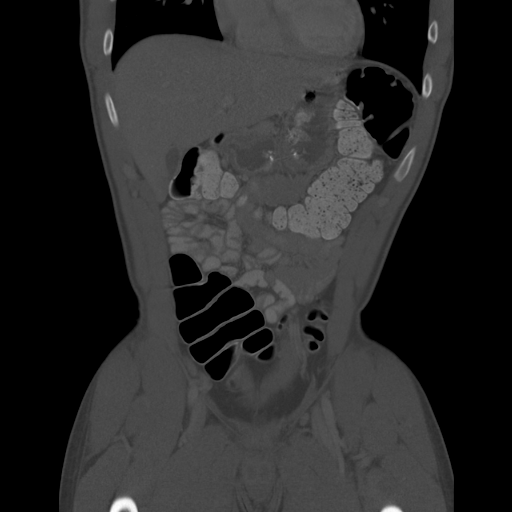

[10 of 36 positions shown; findings below may reference images not displayed]

FINDINGS: The lung bases are clear.  The liver enhances with no
focal abnormality and no ductal dilatation is seen.  No calcified
gallstones are noted.  The pancreas is normal in size and the
pancreatic duct is not dilated.  The adrenal glands and spleen are
unremarkable.  The stomach is moderately fluid distended and
unremarkable.  The kidneys enhance with no calculus or mass and no
hydronephrosis is seen.  The abdominal aorta is normal in caliber.
No adenopathy is noted.

The urinary bladder is unremarkable.  The prostate is normal in
size.  The colon is unremarkable.  The appendix and terminal ileum
appear normal.  There is  slight fullness of the soft tissues of
the rectum, but no definite colonic mass is evident by CT.  No bony
abnormality is seen.
IMPRESSION: No significant abnormality on CT of the abdomen and pelvis.  There
is only slight fullness of the soft tissues of the rectum but no
definite colonic mass is detected by CT.

## 2012-08-14 ENCOUNTER — Other Ambulatory Visit: Payer: Self-pay | Admitting: *Deleted

## 2012-08-14 DIAGNOSIS — B20 Human immunodeficiency virus [HIV] disease: Secondary | ICD-10-CM

## 2012-08-14 MED ORDER — EMTRICITAB-RILPIVIR-TENOFOV DF 200-25-300 MG PO TABS: 1.0000 | ORAL_TABLET | Freq: Every day | ORAL | Status: DC

## 2012-11-13 ENCOUNTER — Other Ambulatory Visit: Payer: Self-pay | Admitting: *Deleted

## 2012-11-13 DIAGNOSIS — B20 Human immunodeficiency virus [HIV] disease: Secondary | ICD-10-CM

## 2012-11-13 MED ORDER — EMTRICITAB-RILPIVIR-TENOFOV DF 200-25-300 MG PO TABS: 1.0000 | ORAL_TABLET | Freq: Every day | ORAL | Status: DC

## 2012-12-10 ENCOUNTER — Other Ambulatory Visit (INDEPENDENT_AMBULATORY_CARE_PROVIDER_SITE_OTHER): Payer: 59

## 2012-12-10 DIAGNOSIS — I10 Essential (primary) hypertension: Secondary | ICD-10-CM

## 2012-12-10 DIAGNOSIS — Z113 Encounter for screening for infections with a predominantly sexual mode of transmission: Secondary | ICD-10-CM

## 2012-12-10 LAB — CBC WITH DIFFERENTIAL/PLATELET
Basophils Absolute: 0 10*3/uL (ref 0.0–0.1)
Basophils Relative: 0 % (ref 0–1)
HCT: 45.6 % (ref 39.0–52.0)
Lymphocytes Relative: 31 % (ref 12–46)
MCHC: 34.9 g/dL (ref 30.0–36.0)
Neutro Abs: 5.9 10*3/uL (ref 1.7–7.7)
Neutrophils Relative %: 64 % (ref 43–77)
Platelets: 276 10*3/uL (ref 150–400)
RDW: 13.6 % (ref 11.5–15.5)
WBC: 9.2 10*3/uL (ref 4.0–10.5)

## 2012-12-10 LAB — COMPLETE METABOLIC PANEL WITH GFR
ALT: 18 U/L (ref 0–53)
AST: 17 U/L (ref 0–37)
Alkaline Phosphatase: 81 U/L (ref 39–117)
BUN: 11 mg/dL (ref 6–23)
Chloride: 104 mEq/L (ref 96–112)
Creat: 1.05 mg/dL (ref 0.50–1.35)
Potassium: 4.3 mEq/L (ref 3.5–5.3)

## 2012-12-10 LAB — LIPID PANEL
Cholesterol: 161 mg/dL (ref 0–200)
HDL: 42 mg/dL (ref >39)
LDL Cholesterol: 104 mg/dL — ABNORMAL HIGH (ref 0–99)
Triglycerides: 74 mg/dL (ref <150)

## 2012-12-10 LAB — RPR

## 2012-12-11 LAB — HIV-1 RNA QUANT-NO REFLEX-BLD
HIV 1 RNA Quant: <20 copies/mL (ref <20)
HIV-1 RNA Quant, Log: <1.3 {Log} (ref <1.30)

## 2012-12-24 ENCOUNTER — Encounter: Payer: Self-pay | Admitting: Infectious Disease

## 2012-12-24 ENCOUNTER — Ambulatory Visit (INDEPENDENT_AMBULATORY_CARE_PROVIDER_SITE_OTHER): Payer: 59 | Admitting: Infectious Disease

## 2012-12-24 DIAGNOSIS — I10 Essential (primary) hypertension: Secondary | ICD-10-CM

## 2012-12-24 DIAGNOSIS — B20 Human immunodeficiency virus [HIV] disease: Secondary | ICD-10-CM

## 2012-12-24 DIAGNOSIS — Z23 Encounter for immunization: Secondary | ICD-10-CM

## 2012-12-24 DIAGNOSIS — A630 Anogenital (venereal) warts: Secondary | ICD-10-CM | POA: Insufficient documentation

## 2012-12-24 MED ORDER — LISINOPRIL 5 MG PO TABS: 5.0000 mg | ORAL_TABLET | Freq: Every day | ORAL | Status: DC

## 2012-12-24 NOTE — Progress Notes (Signed)
  Subjective:    Patient ID: Derek Fuentes, male    DOB: 1985-07-28, 27 y.o.   MRN: [id number]  HPI   Derek Fuentes is a 27 y.o. male who is doing superbly well on hisantiviral regimen, complera with undetectable viral load and health cd4 count.  Again we reviewed concerns about his blood pressure being elevated and we will add in a low dose ACEI.   Review of Systems  Constitutional: Negative for fever, chills, diaphoresis, activity change, appetite change, fatigue and unexpected weight change.  HENT: Negative for congestion, sore throat, rhinorrhea, sneezing, trouble swallowing and sinus pressure.   Eyes: Negative for photophobia and visual disturbance.  Respiratory: Negative for cough, chest tightness, shortness of breath, wheezing and stridor.   Cardiovascular: Negative for chest pain, palpitations and leg swelling.  Gastrointestinal: Negative for nausea, vomiting, abdominal pain, diarrhea, constipation, blood in stool, abdominal distention and anal bleeding.  Genitourinary: Negative for dysuria, hematuria, flank pain and difficulty urinating.  Musculoskeletal: Negative for myalgias, back pain, joint swelling, arthralgias and gait problem.  Skin: Negative for color change, pallor, rash and wound.  Neurological: Negative for dizziness, tremors, weakness and light-headedness.  Hematological: Negative for adenopathy. Does not bruise/bleed easily.  Psychiatric/Behavioral: Negative for behavioral problems, confusion, sleep disturbance, dysphoric mood, decreased concentration and agitation.       Objective:   Physical Exam  Constitutional: He is oriented to person, place, and time. He appears well-developed and well-nourished. No distress.  HENT:  Head: Normocephalic and atraumatic.  Mouth/Throat: Oropharynx is clear and moist. No oropharyngeal exudate.  Eyes: Conjunctivae and EOM are normal. Pupils are equal, round, and reactive to light. No scleral icterus.  Neck: Normal  range of motion. Neck supple. No JVD present.  Cardiovascular: Normal rate, regular rhythm and normal heart sounds.  Exam reveals no gallop and no friction rub.   No murmur heard. Pulmonary/Chest: Effort normal and breath sounds normal. No respiratory distress. He has no wheezes. He has no rales. He exhibits no tenderness.  Abdominal: He exhibits no distension and no mass. There is no tenderness. There is no rebound and no guarding.  Musculoskeletal: He exhibits no edema and no tenderness.  Lymphadenopathy:    He has no cervical adenopathy.  Neurological: He is alert and oriented to person, place, and time. He has normal reflexes. He exhibits normal muscle tone. Coordination normal.  Skin: Skin is warm and dry. He is not diaphoretic. No erythema. No pallor.  Psychiatric: He has a normal mood and affect. His behavior is normal. Judgment and thought content normal.          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

## 2012-12-24 NOTE — Patient Instructions (Addendum)
We need you to come back for blood work and repeat bp check in one week

## 2012-12-31 ENCOUNTER — Other Ambulatory Visit: Payer: 59

## 2013-01-07 ENCOUNTER — Other Ambulatory Visit: Payer: 59

## 2013-01-09 ENCOUNTER — Ambulatory Visit (INDEPENDENT_AMBULATORY_CARE_PROVIDER_SITE_OTHER): Payer: 59 | Admitting: *Deleted

## 2013-01-09 ENCOUNTER — Other Ambulatory Visit: Payer: 59

## 2013-01-09 VITALS — BP 125/84 | HR 89

## 2013-01-09 DIAGNOSIS — I10 Essential (primary) hypertension: Secondary | ICD-10-CM

## 2013-01-09 LAB — BASIC METABOLIC PANEL WITH GFR
BUN: 10 mg/dL (ref 6–23)
CO2: 26 mEq/L (ref 19–32)
Calcium: 9.8 mg/dL (ref 8.4–10.5)
Chloride: 103 mEq/L (ref 96–112)
Glucose, Bld: 98 mg/dL (ref 70–99)
Sodium: 137 mEq/L (ref 135–145)

## 2013-04-22 ENCOUNTER — Other Ambulatory Visit: Payer: 59

## 2013-04-23 ENCOUNTER — Other Ambulatory Visit: Payer: 59

## 2013-04-23 DIAGNOSIS — B20 Human immunodeficiency virus [HIV] disease: Secondary | ICD-10-CM

## 2013-04-23 LAB — COMPLETE METABOLIC PANEL WITH GFR
ALK PHOS: 81 U/L (ref 39–117)
ALT: 31 U/L (ref 0–53)
AST: 25 U/L (ref 0–37)
Albumin: 5.1 g/dL (ref 3.5–5.2)
BILIRUBIN TOTAL: 1.3 mg/dL — AB (ref 0.3–1.2)
BUN: 13 mg/dL (ref 6–23)
CO2: 25 mEq/L (ref 19–32)
CREATININE: 1.02 mg/dL (ref 0.50–1.35)
Calcium: 10.2 mg/dL (ref 8.4–10.5)
Chloride: 102 mEq/L (ref 96–112)
GFR, Est African American: >89 mL/min
GFR, Est Non African American: >89 mL/min
Glucose, Bld: 103 mg/dL — ABNORMAL HIGH (ref 70–99)
Potassium: 4.3 mEq/L (ref 3.5–5.3)
Sodium: 139 mEq/L (ref 135–145)
TOTAL PROTEIN: 8 g/dL (ref 6.0–8.3)

## 2013-04-23 LAB — CBC WITH DIFFERENTIAL/PLATELET
BASOS ABS: 0 10*3/uL (ref 0.0–0.1)
Basophils Relative: 0 % (ref 0–1)
EOS ABS: 0.1 10*3/uL (ref 0.0–0.7)
EOS PCT: 2 % (ref 0–5)
HCT: 48 % (ref 39.0–52.0)
Hemoglobin: 16.7 g/dL (ref 13.0–17.0)
Lymphocytes Relative: 40 % (ref 12–46)
Lymphs Abs: 3 10*3/uL (ref 0.7–4.0)
MCH: 29.6 pg (ref 26.0–34.0)
MCHC: 34.8 g/dL (ref 30.0–36.0)
MCV: 85 fL (ref 78.0–100.0)
Monocytes Absolute: 0.4 10*3/uL (ref 0.1–1.0)
Monocytes Relative: 5 % (ref 3–12)
Neutro Abs: 4.1 10*3/uL (ref 1.7–7.7)
Neutrophils Relative %: 53 % (ref 43–77)
PLATELETS: 271 10*3/uL (ref 150–400)
RBC: 5.65 MIL/uL (ref 4.22–5.81)
RDW: 14.2 % (ref 11.5–15.5)
WBC: 7.5 10*3/uL (ref 4.0–10.5)

## 2013-04-23 LAB — LIPID PANEL
CHOL/HDL RATIO: 4.1 ratio
CHOLESTEROL: 188 mg/dL (ref 0–200)
HDL: 46 mg/dL (ref >39)
LDL CALC: 122 mg/dL — AB (ref 0–99)
TRIGLYCERIDES: 98 mg/dL (ref <150)
VLDL: 20 mg/dL (ref 0–40)

## 2013-04-24 LAB — HIV-1 RNA QUANT-NO REFLEX-BLD
HIV 1 RNA Quant: <20 copies/mL (ref <20)
HIV-1 RNA Quant, Log: <1.3 {Log} (ref <1.30)

## 2013-04-24 LAB — T-HELPER CELL (CD4) - (RCID CLINIC ONLY)
CD4 % Helper T Cell: 22 % — ABNORMAL LOW (ref 33–55)
CD4 T Cell Abs: 690 /uL (ref 400–2700)

## 2013-04-29 LAB — HLA B*5701: HLA-B*5701 w/rflx HLA-B High: NEGATIVE

## 2013-05-06 ENCOUNTER — Ambulatory Visit: Payer: 59 | Admitting: Infectious Disease

## 2013-05-21 ENCOUNTER — Ambulatory Visit (INDEPENDENT_AMBULATORY_CARE_PROVIDER_SITE_OTHER): Payer: 59 | Admitting: Infectious Disease

## 2013-05-21 ENCOUNTER — Encounter: Payer: Self-pay | Admitting: Infectious Disease

## 2013-05-21 VITALS — BP 146/82 | HR 96 | Temp 98.2°F | Wt 179.0 lb

## 2013-05-21 DIAGNOSIS — B20 Human immunodeficiency virus [HIV] disease: Secondary | ICD-10-CM

## 2013-05-21 NOTE — Progress Notes (Signed)
  Subjective:    Patient ID: Derek KhanGregory C Fuentes  JR, male    DOB: June 17, 1985, 28 y.o.   MRN: [id number]  HPI   Derek KhanGregory C Fuentes JR is a 28 year old male who is doing superbly well on hisantiviral regimen, complera with undetectable viral load and health cd4 count.  Again we reviewed concerns about his blood pressure being elevated despite low dose ACEI. I supsect there is degree of white coat HTN.    Review of Systems  Constitutional: Negative for fever, chills, diaphoresis, activity change, appetite change, fatigue and unexpected weight change.  HENT: Negative for congestion, rhinorrhea, sinus pressure, sneezing, sore throat and trouble swallowing.   Eyes: Negative for photophobia and visual disturbance.  Respiratory: Negative for cough, chest tightness, shortness of breath, wheezing and stridor.   Cardiovascular: Negative for chest pain, palpitations and leg swelling.  Gastrointestinal: Negative for nausea, vomiting, abdominal pain, diarrhea, constipation, blood in stool, abdominal distention and anal bleeding.  Genitourinary: Negative for dysuria, hematuria, flank pain and difficulty urinating.  Musculoskeletal: Negative for arthralgias, back pain, gait problem, joint swelling and myalgias.  Skin: Negative for color change, pallor, rash and wound.  Neurological: Negative for dizziness, tremors, weakness and light-headedness.  Hematological: Negative for adenopathy. Does not bruise/bleed easily.  Psychiatric/Behavioral: Negative for behavioral problems, confusion, sleep disturbance, dysphoric mood, decreased concentration and agitation.       Objective:   Physical Exam  Constitutional: He is oriented to person, place, and time. He appears well-developed and well-nourished. No distress.  HENT:  Head: Normocephalic and atraumatic.  Mouth/Throat: Oropharynx is clear and moist. No oropharyngeal exudate.  Eyes: Conjunctivae and EOM are normal. Pupils are equal, round, and reactive to  light. No scleral icterus.  Neck: Normal range of motion. Neck supple. No JVD present.  Cardiovascular: Normal rate, regular rhythm and normal heart sounds.  Exam reveals no gallop and no friction rub.   No murmur heard. Pulmonary/Chest: Effort normal and breath sounds normal. No respiratory distress. He has no wheezes. He has no rales. He exhibits no tenderness.  Abdominal: He exhibits no distension and no mass. There is no tenderness. There is no rebound and no guarding.  Musculoskeletal: He exhibits no edema and no tenderness.  Lymphadenopathy:    He has no cervical adenopathy.  Neurological: He is alert and oriented to person, place, and time. He has normal reflexes. He exhibits normal muscle tone. Coordination normal.  Skin: Skin is warm and dry. He is not diaphoretic. No erythema. No pallor.  Psychiatric: He has a normal mood and affect. His behavior is normal. Judgment and thought content normal.          Assessment & Plan:  HIV: continue Complera, recheck labs in 6 months. I spent greater than 25 minutes with the patient including greater than 50% of time in face to face counsel of the patient and in coordination of their care.   HTN: continue  Lisinopril, ambulatory BP cuff  Genital warts: will refer to Dr. Ninetta LightsHatcher for HRA consideration

## 2013-08-22 ENCOUNTER — Emergency Department (INDEPENDENT_AMBULATORY_CARE_PROVIDER_SITE_OTHER): Payer: 59

## 2013-08-22 ENCOUNTER — Encounter (HOSPITAL_COMMUNITY): Payer: Self-pay | Admitting: Emergency Medicine

## 2013-08-22 ENCOUNTER — Emergency Department (HOSPITAL_COMMUNITY)
Admission: EM | Admit: 2013-08-22 | Discharge: 2013-08-22 | Disposition: A | Discharge status: Home / Self Care | Payer: 59 | Source: Home / Self Care

## 2013-08-22 DIAGNOSIS — J069 Acute upper respiratory infection, unspecified: Secondary | ICD-10-CM

## 2013-08-22 MED ORDER — GUAIFENESIN-CODEINE 100-10 MG/5ML PO SYRP: 10.0000 mL | ORAL_SOLUTION | Freq: Four times a day (QID) | ORAL | Status: DC | PRN

## 2013-08-22 MED ORDER — FLUTICASONE PROPIONATE 50 MCG/ACT NA SUSP: 1.0000 | Freq: Two times a day (BID) | NASAL | Status: DC

## 2013-08-22 MED ORDER — ALBUTEROL SULFATE (2.5 MG/3ML) 0.083% IN NEBU: 5.0000 mg | INHALATION_SOLUTION | Freq: Once | RESPIRATORY_TRACT | Status: DC

## 2013-08-22 MED ORDER — IPRATROPIUM BROMIDE 0.02 % IN SOLN: 0.5000 mg | Freq: Once | RESPIRATORY_TRACT | Status: DC

## 2013-08-22 MED ORDER — AZITHROMYCIN 250 MG PO TABS: ORAL_TABLET | ORAL | Status: DC

## 2013-08-22 NOTE — Discharge Instructions (Signed)
Drink plenty of fluids as discussed, use medicine as prescribed, and mucinex or delsym for cough. Return or see your doctor if further problems °

## 2013-08-22 NOTE — ED Notes (Signed)
Pt  Reports      Symptoms      Of  Cough       Congested   sorethroat          With  Symptoms   X     2  Days             Pt  Reports  Symptoms  Not  releived  By  nyquil          -  Pt  Reports       The     Cough  For  The  Most  Part  Has  Been  Non  Productive

## 2013-08-22 NOTE — ED Provider Notes (Signed)
CSN: [id number]     Arrival date & time 08/22/13  3838 History   None    Chief Complaint  Patient presents with  . Cough   (Consider location/radiation/quality/duration/timing/severity/associated sxs/prior Treatment) Patient is a 28 y.o. male presenting with cough. The history is provided by the patient.  Cough Cough characteristics:  Non-productive Severity:  Moderate Onset quality:  Gradual Duration:  3 days Progression:  Unchanged Chronicity:  New Smoker: no   Context: exposure to allergens and weather changes   Relieved by:  None tried Worsened by:  Nothing tried Associated symptoms: rhinorrhea and shortness of breath   Associated symptoms: no fever and no wheezing     Past Medical History  Diagnosis Date  . HIV disease   . HTN (hypertension)   . Condyloma    Past Surgical History  Procedure Laterality Date  . Wart removal     Family History  Problem Relation Age of Onset  . Hypertension Mother   . Diabetes Father   . Cancer Maternal Grandfather   . Alzheimer's disease Paternal Grandmother   . Diabetes Paternal Grandfather    History  Substance Use Topics  . Smoking status: Never Smoker   . Smokeless tobacco: Never Used  . Alcohol Use: 1.5 oz/week    3 drink(s) per week    Review of Systems  Constitutional: Negative.  Negative for fever.  HENT: Positive for congestion and rhinorrhea.   Respiratory: Positive for cough and shortness of breath. Negative for wheezing.   Cardiovascular: Negative.     Allergies  Review of patient's allergies indicates no known allergies.  Home Medications   Prior to Admission medications   Medication Sig Start Date End Date Taking? Authorizing Provider  Emtricitab-Rilpivir-Tenofovir 200-25-300 MG TABS Take 1 tablet by mouth daily. With 400-calorie meal 11/13/12   Randall Hiss, MD  lisinopril (PRINIVIL,ZESTRIL) 5 MG tablet Take 1 tablet (5 mg total) by mouth daily. 12/24/12   Randall Hiss, MD   There were no  vitals taken for this visit. Physical Exam  Nursing note and vitals reviewed. Constitutional: He is oriented to person, place, and time. He appears well-developed and well-nourished. No distress.  HENT:  Head: Normocephalic.  Right Ear: External ear normal.  Left Ear: External ear normal.  Mouth/Throat: Oropharynx is clear and moist.  Neck: Normal range of motion. Neck supple.  Cardiovascular: Normal heart sounds and intact distal pulses.   Pulmonary/Chest: Effort normal and breath sounds normal.  Lymphadenopathy:    He has no cervical adenopathy.  Neurological: He is alert and oriented to person, place, and time.  Skin: Skin is warm and dry.    ED Course  Procedures (including critical care time) Labs Review Labs Reviewed - No data to display  Imaging Review Dg Chest 2 View  08/22/2013   CLINICAL DATA:  Cough  EXAM: CHEST - 2 VIEW  COMPARISON:  None available  FINDINGS: Lungs are clear. Heart size and mediastinal contours are within normal limits. No effusion. Visualized skeletal structures are unremarkable.  IMPRESSION: No acute cardiopulmonary disease.   Electronically Signed   By: Oley Balm M.D.   On: 08/22/2013 10:51    X-rays reviewed and report per radiologist.  MDM   1. URI (upper respiratory infection)        Linna Hoff, MD 08/22/13 1104

## 2013-11-03 ENCOUNTER — Other Ambulatory Visit: Payer: 59

## 2013-11-03 DIAGNOSIS — B20 Human immunodeficiency virus [HIV] disease: Secondary | ICD-10-CM

## 2013-11-03 DIAGNOSIS — Z113 Encounter for screening for infections with a predominantly sexual mode of transmission: Secondary | ICD-10-CM

## 2013-11-03 DIAGNOSIS — Z79899 Other long term (current) drug therapy: Secondary | ICD-10-CM

## 2013-11-03 LAB — COMPLETE METABOLIC PANEL WITH GFR
ALK PHOS: 68 U/L (ref 39–117)
ALT: 36 U/L (ref 0–53)
AST: 27 U/L (ref 0–37)
Albumin: 4.8 g/dL (ref 3.5–5.2)
BUN: 11 mg/dL (ref 6–23)
CO2: 25 mEq/L (ref 19–32)
Calcium: 9.6 mg/dL (ref 8.4–10.5)
Chloride: 105 mEq/L (ref 96–112)
Creat: 1.01 mg/dL (ref 0.50–1.35)
GFR, Est African American: >89 mL/min
GFR, Est Non African American: >89 mL/min
Glucose, Bld: 94 mg/dL (ref 70–99)
Potassium: 4.2 mEq/L (ref 3.5–5.3)
SODIUM: 139 meq/L (ref 135–145)
TOTAL PROTEIN: 7.2 g/dL (ref 6.0–8.3)
Total Bilirubin: 0.9 mg/dL (ref 0.2–1.2)

## 2013-11-03 LAB — CBC WITH DIFFERENTIAL/PLATELET
Basophils Absolute: 0 10*3/uL (ref 0.0–0.1)
Basophils Relative: 0 % (ref 0–1)
EOS ABS: 0.1 10*3/uL (ref 0.0–0.7)
Eosinophils Relative: 1 % (ref 0–5)
HCT: 44.3 % (ref 39.0–52.0)
Hemoglobin: 15.5 g/dL (ref 13.0–17.0)
Lymphocytes Relative: 32 % (ref 12–46)
Lymphs Abs: 2.5 10*3/uL (ref 0.7–4.0)
MCH: 29 pg (ref 26.0–34.0)
MCHC: 35 g/dL (ref 30.0–36.0)
MCV: 82.8 fL (ref 78.0–100.0)
Monocytes Absolute: 0.5 10*3/uL (ref 0.1–1.0)
Monocytes Relative: 6 % (ref 3–12)
NEUTROS PCT: 61 % (ref 43–77)
Neutro Abs: 4.7 10*3/uL (ref 1.7–7.7)
PLATELETS: 247 10*3/uL (ref 150–400)
RBC: 5.35 MIL/uL (ref 4.22–5.81)
RDW: 14 % (ref 11.5–15.5)
WBC: 7.7 10*3/uL (ref 4.0–10.5)

## 2013-11-03 LAB — LIPID PANEL
CHOL/HDL RATIO: 3.8 ratio
Cholesterol: 151 mg/dL (ref 0–200)
HDL: 40 mg/dL (ref >39)
LDL Cholesterol: 104 mg/dL — ABNORMAL HIGH (ref 0–99)
Triglycerides: 37 mg/dL (ref <150)
VLDL: 7 mg/dL (ref 0–40)

## 2013-11-04 LAB — HEPATITIS C ANTIBODY: HCV Ab: NEGATIVE

## 2013-11-04 LAB — MICROALBUMIN / CREATININE URINE RATIO
Creatinine, Urine: 140.3 mg/dL
MICROALB UR: 0.58 mg/dL (ref 0.00–1.89)
Microalb Creat Ratio: 4.1 mg/g (ref 0.0–30.0)

## 2013-11-04 LAB — T-HELPER CELL (CD4) - (RCID CLINIC ONLY)
CD4 % Helper T Cell: 22 % — ABNORMAL LOW (ref 33–55)
CD4 T Cell Abs: 610 /uL (ref 400–2700)

## 2013-11-04 LAB — RPR

## 2013-11-05 LAB — HIV-1 RNA QUANT-NO REFLEX-BLD: HIV-1 RNA Quant, Log: <1.3 {Log} (ref <1.30)

## 2013-11-10 ENCOUNTER — Other Ambulatory Visit: Payer: Self-pay | Admitting: *Deleted

## 2013-11-10 DIAGNOSIS — B20 Human immunodeficiency virus [HIV] disease: Secondary | ICD-10-CM

## 2013-11-10 MED ORDER — EMTRICITAB-RILPIVIR-TENOFOV DF 200-25-300 MG PO TABS: 1.0000 | ORAL_TABLET | Freq: Every day | ORAL | Status: DC

## 2013-11-18 ENCOUNTER — Ambulatory Visit: Payer: 59 | Admitting: Infectious Disease

## 2014-01-04 ENCOUNTER — Encounter: Payer: Self-pay | Admitting: Infectious Disease

## 2014-01-04 ENCOUNTER — Ambulatory Visit (INDEPENDENT_AMBULATORY_CARE_PROVIDER_SITE_OTHER): Payer: 59 | Admitting: Infectious Disease

## 2014-01-04 VITALS — BP 145/85 | HR 99 | Temp 97.4°F | Wt 171.0 lb

## 2014-01-04 DIAGNOSIS — A630 Anogenital (venereal) warts: Secondary | ICD-10-CM

## 2014-01-04 DIAGNOSIS — I10 Essential (primary) hypertension: Secondary | ICD-10-CM

## 2014-01-04 DIAGNOSIS — Z23 Encounter for immunization: Secondary | ICD-10-CM

## 2014-01-04 DIAGNOSIS — B20 Human immunodeficiency virus [HIV] disease: Secondary | ICD-10-CM

## 2014-01-04 MED ORDER — EMTRICITAB-RILPIVIR-TENOFOV DF 200-25-300 MG PO TABS: 1.0000 | ORAL_TABLET | Freq: Every day | ORAL | Status: DC

## 2014-01-04 NOTE — Progress Notes (Signed)
  Subjective:    Patient ID: Derek KhanGregory C Gaffin Jr., male    DOB: [birth year], 28 y.o.   MRN: [id number]  HPI   Derek KhanGregory C Marsalis JR is a 28 year old male who is doing superbly well on hisantiviral regimen, complera with undetectable viral load and health cd4 count.   Lab Results  Component Value Date   HIV1RNAQUANT <20 11/03/2013   Lab Results  Component Value Date   CD4TABS 610 11/03/2013   CD4TABS 690 04/23/2013   CD4TABS 640 12/10/2012     Again we reviewed concerns about his blood pressure being elevated despite low dose ACEI. He has been following BP at home at there is running no higher than 120 max systolic.  Review of Systems  Constitutional: Negative for fever, chills, diaphoresis, activity change, appetite change, fatigue and unexpected weight change.  HENT: Negative for congestion, rhinorrhea, sinus pressure, sneezing, sore throat and trouble swallowing.   Eyes: Negative for photophobia and visual disturbance.  Respiratory: Negative for cough, chest tightness, shortness of breath, wheezing and stridor.   Cardiovascular: Negative for chest pain, palpitations and leg swelling.  Gastrointestinal: Negative for nausea, vomiting, abdominal pain, diarrhea, constipation, blood in stool, abdominal distention and anal bleeding.  Genitourinary: Negative for dysuria, hematuria, flank pain and difficulty urinating.  Musculoskeletal: Negative for arthralgias, back pain, gait problem, joint swelling and myalgias.  Skin: Negative for color change, pallor, rash and wound.  Neurological: Negative for dizziness, tremors, weakness and light-headedness.  Hematological: Negative for adenopathy. Does not bruise/bleed easily.  Psychiatric/Behavioral: Negative for behavioral problems, confusion, sleep disturbance, dysphoric mood, decreased concentration and agitation.       Objective:   Physical Exam  Constitutional: He is oriented to person, place, and time. He appears well-developed and  well-nourished. No distress.  HENT:  Head: Normocephalic and atraumatic.  Mouth/Throat: Oropharynx is clear and moist. No oropharyngeal exudate.  Eyes: Conjunctivae and EOM are normal. Pupils are equal, round, and reactive to light. No scleral icterus.  Neck: Normal range of motion. Neck supple. No JVD present.  Cardiovascular: Normal rate, regular rhythm and normal heart sounds.  Exam reveals no gallop and no friction rub.   No murmur heard. Pulmonary/Chest: Effort normal and breath sounds normal. No respiratory distress. He has no wheezes. He has no rales. He exhibits no tenderness.  Abdominal: He exhibits no distension and no mass. There is no tenderness. There is no rebound and no guarding.  Musculoskeletal: He exhibits no edema and no tenderness.  Lymphadenopathy:    He has no cervical adenopathy.  Neurological: He is alert and oriented to person, place, and time. He has normal reflexes. He exhibits normal muscle tone. Coordination normal.  Skin: Skin is warm and dry. He is not diaphoretic. No erythema. No pallor.  Psychiatric: He has a normal mood and affect. His behavior is normal. Judgment and thought content normal.          Assessment & Plan:  HIV: continue Complera, recheck labs in 6 months. I spent greater than 25 minutes with the patient including greater than 50% of time in face to face counsel of the patient and in coordination of their care.   HTN: continue  Lisinopril, ambulatory BP cuff  Genital warts: will refer to Dr. Ninetta LightsHatcher for HRA consideration

## 2014-07-06 ENCOUNTER — Other Ambulatory Visit: Payer: 59

## 2014-07-06 DIAGNOSIS — B20 Human immunodeficiency virus [HIV] disease: Secondary | ICD-10-CM

## 2014-07-06 LAB — CBC WITH DIFFERENTIAL/PLATELET
Basophils Absolute: 0 10*3/uL (ref 0.0–0.1)
Basophils Relative: 0 % (ref 0–1)
EOS ABS: 0 10*3/uL (ref 0.0–0.7)
Eosinophils Relative: 0 % (ref 0–5)
HCT: 47.2 % (ref 39.0–52.0)
Hemoglobin: 16.1 g/dL (ref 13.0–17.0)
Lymphocytes Relative: 18 % (ref 12–46)
Lymphs Abs: 2.2 10*3/uL (ref 0.7–4.0)
MCH: 28.9 pg (ref 26.0–34.0)
MCHC: 34.1 g/dL (ref 30.0–36.0)
MCV: 84.7 fL (ref 78.0–100.0)
MPV: 10.4 fL (ref 8.6–12.4)
Monocytes Absolute: 0.6 10*3/uL (ref 0.1–1.0)
Monocytes Relative: 5 % (ref 3–12)
NEUTROS PCT: 77 % (ref 43–77)
Neutro Abs: 9.2 10*3/uL — ABNORMAL HIGH (ref 1.7–7.7)
Platelets: 294 10*3/uL (ref 150–400)
RBC: 5.57 MIL/uL (ref 4.22–5.81)
RDW: 14.2 % (ref 11.5–15.5)
WBC: 12 10*3/uL — ABNORMAL HIGH (ref 4.0–10.5)

## 2014-07-06 LAB — COMPLETE METABOLIC PANEL WITH GFR
ALT: 32 U/L (ref 0–53)
AST: 26 U/L (ref 0–37)
Albumin: 5.1 g/dL (ref 3.5–5.2)
Alkaline Phosphatase: 80 U/L (ref 39–117)
BILIRUBIN TOTAL: 1.3 mg/dL — AB (ref 0.2–1.2)
BUN: 10 mg/dL (ref 6–23)
CALCIUM: 9.7 mg/dL (ref 8.4–10.5)
CHLORIDE: 104 meq/L (ref 96–112)
CO2: 22 meq/L (ref 19–32)
CREATININE: 0.93 mg/dL (ref 0.50–1.35)
GLUCOSE: 89 mg/dL (ref 70–99)
Potassium: 4.2 mEq/L (ref 3.5–5.3)
Sodium: 140 mEq/L (ref 135–145)
TOTAL PROTEIN: 7.9 g/dL (ref 6.0–8.3)

## 2014-07-06 LAB — LIPID PANEL
CHOLESTEROL: 169 mg/dL (ref 0–200)
HDL: 42 mg/dL (ref >=40)
LDL Cholesterol: 117 mg/dL — ABNORMAL HIGH (ref 0–99)
TRIGLYCERIDES: 50 mg/dL (ref <150)
Total CHOL/HDL Ratio: 4 Ratio
VLDL: 10 mg/dL (ref 0–40)

## 2014-07-06 LAB — RPR

## 2014-07-07 LAB — HIV-1 RNA QUANT-NO REFLEX-BLD
HIV 1 RNA Quant: <20 copies/mL (ref <20)
HIV-1 RNA Quant, Log: <1.3 {Log} (ref <1.30)

## 2014-07-07 LAB — MICROALBUMIN / CREATININE URINE RATIO
Creatinine, Urine: 267.1 mg/dL
MICROALB UR: 3.3 mg/dL — AB (ref <2.0)
MICROALB/CREAT RATIO: 12.4 mg/g (ref 0.0–30.0)

## 2014-07-07 LAB — URINE CYTOLOGY ANCILLARY ONLY
Chlamydia: NEGATIVE
NEISSERIA GONORRHEA: NEGATIVE

## 2014-07-07 LAB — T-HELPER CELL (CD4) - (RCID CLINIC ONLY)
CD4 % Helper T Cell: 24 % — ABNORMAL LOW (ref 33–55)
CD4 T Cell Abs: 500 /uL (ref 400–2700)

## 2014-07-26 ENCOUNTER — Encounter: Payer: Self-pay | Admitting: Infectious Disease

## 2014-07-26 ENCOUNTER — Ambulatory Visit (INDEPENDENT_AMBULATORY_CARE_PROVIDER_SITE_OTHER): Payer: 59 | Admitting: Infectious Disease

## 2014-07-26 VITALS — BP 132/89 | HR 88 | Wt 170.0 lb

## 2014-07-26 DIAGNOSIS — B20 Human immunodeficiency virus [HIV] disease: Secondary | ICD-10-CM

## 2014-07-26 DIAGNOSIS — I10 Essential (primary) hypertension: Secondary | ICD-10-CM

## 2014-07-26 HISTORY — DX: Other disorders of bilirubin metabolism: E80.6

## 2014-07-26 MED ORDER — EMTRICITAB-RILPIVIR-TENOFOV AF 200-25-25 MG PO TABS: 1.0000 | ORAL_TABLET | Freq: Every day | ORAL | Status: DC

## 2014-07-26 NOTE — Progress Notes (Signed)
Subjective:    Patient ID: Derek Khan., male    DOB: 1986-03-19, 29 y.o.   MRN: [id number]  HPI   Derek Fuentes is a 29 year old male who is doing superbly well on hisantiviral regimen, complera with undetectable viral load and health cd4 count.   Lab Results  Component Value Date   HIV1RNAQUANT <20 07/06/2014   Lab Results  Component Value Date   CD4TABS 500 07/06/2014   CD4TABS 610 11/03/2013   CD4TABS 690 04/23/2013    He was concerned about his CD4 having dropped from 600s to 500s but I explained to him exhaustively that this was just variation in normal healthy numbers and had nothing to do with this HIV which is perfectly controlled.  We also reviewed all of his other labs including his bilirubin.  Review of Systems  Constitutional: Negative for fever, chills, diaphoresis, activity change, appetite change, fatigue and unexpected weight change.  HENT: Negative for congestion, rhinorrhea, sinus pressure, sneezing, sore throat and trouble swallowing.   Eyes: Negative for photophobia and visual disturbance.  Respiratory: Negative for cough, chest tightness, shortness of breath, wheezing and stridor.   Cardiovascular: Negative for chest pain, palpitations and leg swelling.  Gastrointestinal: Negative for nausea, vomiting, abdominal pain, diarrhea, constipation, blood in stool, abdominal distention and anal bleeding.  Genitourinary: Negative for dysuria, hematuria, flank pain and difficulty urinating.  Musculoskeletal: Negative for myalgias, back pain, joint swelling, arthralgias and gait problem.  Skin: Negative for color change, pallor, rash and wound.  Neurological: Negative for dizziness, tremors, weakness and light-headedness.  Hematological: Negative for adenopathy. Does not bruise/bleed easily.  Psychiatric/Behavioral: Negative for behavioral problems, confusion, sleep disturbance, dysphoric mood, decreased concentration and agitation.       Objective:    Physical Exam  Constitutional: He is oriented to person, place, and time. He appears well-developed and well-nourished. No distress.  HENT:  Head: Normocephalic and atraumatic.  Mouth/Throat: Oropharynx is clear and moist. No oropharyngeal exudate.  Eyes: Conjunctivae and EOM are normal. Pupils are equal, round, and reactive to light. No scleral icterus.  Neck: Normal range of motion. Neck supple. No JVD present.  Cardiovascular: Normal rate, regular rhythm and normal heart sounds.  Exam reveals no gallop and no friction rub.   No murmur heard. Pulmonary/Chest: Effort normal and breath sounds normal. No respiratory distress. He has no wheezes. He has no rales. He exhibits no tenderness.  Abdominal: He exhibits no distension and no mass. There is no tenderness. There is no rebound and no guarding.  Musculoskeletal: He exhibits no edema or tenderness.  Lymphadenopathy:    He has no cervical adenopathy.  Neurological: He is alert and oriented to person, place, and time. He has normal reflexes. He exhibits normal muscle tone. Coordination normal.  Skin: Skin is warm and dry. He is not diaphoretic. No erythema. No pallor.  Psychiatric: He has a normal mood and affect. His behavior is normal. Judgment and thought content normal.          Assessment & Plan:  HIV: change to ODEFSEY (same as Complera but with TAF ) for better bone and kidney safety recheck labs in 6 months. I spent greater than 25 minutes with the patient including greater than 50% of time in face to face counsel of the patient re HIV, CD4 counts nature of immune system and in coordination of their care.   HTN: continue norvasc. 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

## 2015-02-09 ENCOUNTER — Other Ambulatory Visit: Payer: 59

## 2015-02-10 ENCOUNTER — Encounter: Payer: Self-pay | Admitting: Infectious Disease

## 2015-02-14 ENCOUNTER — Other Ambulatory Visit: Payer: 59

## 2015-02-14 DIAGNOSIS — B20 Human immunodeficiency virus [HIV] disease: Secondary | ICD-10-CM

## 2015-02-14 LAB — COMPLETE METABOLIC PANEL WITH GFR
ALBUMIN: 4.8 g/dL (ref 3.6–5.1)
ALK PHOS: 60 U/L (ref 40–115)
ALT: 27 U/L (ref 9–46)
AST: 23 U/L (ref 10–40)
BUN: 11 mg/dL (ref 7–25)
CALCIUM: 10 mg/dL (ref 8.6–10.3)
CO2: 25 mmol/L (ref 20–31)
Chloride: 104 mmol/L (ref 98–110)
Creat: 0.9 mg/dL (ref 0.60–1.35)
GFR, Est African American: >89 mL/min (ref >=60)
GFR, Est Non African American: >89 mL/min (ref >=60)
GLUCOSE: 106 mg/dL — AB (ref 65–99)
POTASSIUM: 4.4 mmol/L (ref 3.5–5.3)
Sodium: 141 mmol/L (ref 135–146)
Total Bilirubin: 1.4 mg/dL — ABNORMAL HIGH (ref 0.2–1.2)
Total Protein: 7.3 g/dL (ref 6.1–8.1)

## 2015-02-14 LAB — LIPID PANEL
Cholesterol: 183 mg/dL (ref 125–200)
HDL: 52 mg/dL (ref >=40)
LDL Cholesterol: 120 mg/dL (ref <130)
Total CHOL/HDL Ratio: 3.5 Ratio (ref <=5.0)
Triglycerides: 56 mg/dL (ref <150)
VLDL: 11 mg/dL (ref <30)

## 2015-02-14 LAB — RPR

## 2015-02-15 LAB — T-HELPER CELL (CD4) - (RCID CLINIC ONLY)
CD4 T CELL HELPER: 23 % — AB (ref 33–55)
CD4 T Cell Abs: 620 /uL (ref 400–2700)

## 2015-02-15 LAB — URINE CYTOLOGY ANCILLARY ONLY
CHLAMYDIA, DNA PROBE: NEGATIVE
NEISSERIA GONORRHEA: NEGATIVE

## 2015-02-15 LAB — MICROALBUMIN / CREATININE URINE RATIO
Creatinine, Urine: 142 mg/dL (ref 20–370)
MICROALB/CREAT RATIO: 4 ug/mg{creat} (ref <30)
Microalb, Ur: 0.5 mg/dL

## 2015-02-16 LAB — HIV-1 RNA QUANT-NO REFLEX-BLD: HIV 1 RNA Quant: <20 copies/mL (ref <20)

## 2015-02-23 ENCOUNTER — Encounter: Payer: Self-pay | Admitting: Infectious Disease

## 2015-02-23 ENCOUNTER — Ambulatory Visit (INDEPENDENT_AMBULATORY_CARE_PROVIDER_SITE_OTHER): Payer: 59 | Admitting: Infectious Disease

## 2015-02-23 VITALS — BP 129/82 | HR 87 | Temp 98.2°F | Ht 68.0 in | Wt 169.0 lb

## 2015-02-23 DIAGNOSIS — R142 Eructation: Secondary | ICD-10-CM | POA: Insufficient documentation

## 2015-02-23 DIAGNOSIS — B20 Human immunodeficiency virus [HIV] disease: Secondary | ICD-10-CM | POA: Diagnosis not present

## 2015-02-23 DIAGNOSIS — I10 Essential (primary) hypertension: Secondary | ICD-10-CM

## 2015-02-23 HISTORY — DX: Eructation: R14.2

## 2015-02-23 NOTE — Progress Notes (Signed)
Chief complaint: Burping   Subjective:    Patient ID: Derek Fuentes., male    DOB: 03-19-1986, 29 y.o.   MRN: [id number]  HPI   Derek Fuentes is a 29 year old male who is doing superbly well on hisantiviral regimen, complera --> ODEFSEY with undetectable viral load and health cd4 count. Over last month he has noticed increased frequency of burping. He has not been taking any antacids or proton pump inhibitors for this.  Lab Results  Component Value Date   HIV1RNAQUANT <20 02/14/2015   Lab Results  Component Value Date   CD4TABS 620 02/14/2015   CD4TABS 500 07/06/2014   CD4TABS 610 11/03/2013    Past Medical History  Diagnosis Date  . HIV disease (HCC)   . HTN (hypertension)   . Condyloma   . Hyperbilirubinemia 07/26/2014    Past Surgical History  Procedure Laterality Date  . Wart removal      Family History  Problem Relation Age of Onset  . Hypertension Mother   . Diabetes Father   . Cancer Maternal Grandfather   . Alzheimer's disease Paternal Grandmother   . Diabetes Paternal Grandfather       Social History   Social History  . Marital Status: Single    Spouse Name: N/A  . Number of Children: N/A  . Years of Education: N/A   Social History Main Topics  . Smoking status: Never Smoker   . Smokeless tobacco: Never Used  . Alcohol Use: 1.5 oz/week    3 drink(s) per week  . Drug Use: No  . Sexual Activity: Yes   Other Topics Concern  . None   Social History Narrative    No Known Allergies   Current outpatient prescriptions:  .  amLODipine (NORVASC) 5 MG tablet, Take 5 mg by mouth daily., Disp: , Rfl:  .  Emtricitab-Rilpivir-Tenofov AF (ODEFSEY) 200-25-25 MG TABS, Take 1 tablet by mouth daily with lunch., Disp: 30 tablet, Rfl: 11   Review of Systems  Constitutional: Negative for fever, chills, diaphoresis, activity change, appetite change, fatigue and unexpected weight change.  HENT: Negative for congestion, rhinorrhea, sinus  pressure, sneezing, sore throat and trouble swallowing.   Eyes: Negative for photophobia and visual disturbance.  Respiratory: Negative for cough, chest tightness, shortness of breath, wheezing and stridor.   Cardiovascular: Negative for chest pain, palpitations and leg swelling.  Gastrointestinal: Negative for nausea, vomiting, abdominal pain, diarrhea, constipation, blood in stool, abdominal distention and anal bleeding.  Genitourinary: Negative for dysuria, hematuria, flank pain and difficulty urinating.  Musculoskeletal: Negative for myalgias, back pain, joint swelling, arthralgias and gait problem.  Skin: Negative for color change, pallor, rash and wound.  Neurological: Negative for dizziness, tremors, weakness and light-headedness.  Hematological: Negative for adenopathy. Does not bruise/bleed easily.  Psychiatric/Behavioral: Negative for behavioral problems, confusion, sleep disturbance, dysphoric mood, decreased concentration and agitation.       Objective:   Physical Exam  Constitutional: He is oriented to person, place, and time. He appears well-developed and well-nourished. No distress.  HENT:  Head: Normocephalic and atraumatic.  Mouth/Throat: Oropharynx is clear and moist. No oropharyngeal exudate.  Eyes: Conjunctivae and EOM are normal. Pupils are equal, round, and reactive to light. No scleral icterus.  Neck: Normal range of motion. Neck supple. No JVD present.  Cardiovascular: Normal rate, regular rhythm and normal heart sounds.  Exam reveals no gallop and no friction rub.   No murmur heard. Pulmonary/Chest: Effort normal and breath sounds  normal. No respiratory distress. He has no wheezes. He has no rales. He exhibits no tenderness.  Abdominal: He exhibits no distension and no mass. There is no tenderness. There is no rebound and no guarding.  Musculoskeletal: He exhibits no edema or tenderness.  Lymphadenopathy:    He has no cervical adenopathy.  Neurological: He is  alert and oriented to person, place, and time. He has normal reflexes. He exhibits normal muscle tone. Coordination normal.  Skin: Skin is warm and dry. He is not diaphoretic. No erythema. No pallor.  Psychiatric: He has a normal mood and affect. His behavior is normal. Judgment and thought content normal.          Assessment & Plan:  Burping: could be due to GERD but we cannot use H2 blockers or PPI. IF we need to do this will need to  [facility] and DESCOVY versus TRIUMEQ though he dislikes the size of the latter pill.  At present he wishes to continue on his current antiretroviral regimen and see how much problems he has still remaining from the burping  HIV:  See above discussion  HTN: continue norvasc. 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

## 2015-06-13 ENCOUNTER — Other Ambulatory Visit: Payer: 59

## 2015-06-13 DIAGNOSIS — B20 Human immunodeficiency virus [HIV] disease: Secondary | ICD-10-CM

## 2015-06-13 LAB — COMPLETE METABOLIC PANEL WITH GFR
ALBUMIN: 4.7 g/dL (ref 3.6–5.1)
ALK PHOS: 63 U/L (ref 40–115)
ALT: 26 U/L (ref 9–46)
AST: 25 U/L (ref 10–40)
BILIRUBIN TOTAL: 1 mg/dL (ref 0.2–1.2)
BUN: 11 mg/dL (ref 7–25)
CO2: 26 mmol/L (ref 20–31)
Calcium: 9.7 mg/dL (ref 8.6–10.3)
Chloride: 104 mmol/L (ref 98–110)
Creat: 0.79 mg/dL (ref 0.60–1.35)
GLUCOSE: 101 mg/dL — AB (ref 65–99)
POTASSIUM: 3.9 mmol/L (ref 3.5–5.3)
SODIUM: 140 mmol/L (ref 135–146)
Total Protein: 7.4 g/dL (ref 6.1–8.1)

## 2015-06-13 LAB — CBC WITH DIFFERENTIAL/PLATELET
BASOS PCT: 0 % (ref 0–1)
Basophils Absolute: 0 10*3/uL (ref 0.0–0.1)
EOS ABS: 0.1 10*3/uL (ref 0.0–0.7)
Eosinophils Relative: 2 % (ref 0–5)
HCT: 45 % (ref 39.0–52.0)
Hemoglobin: 15.4 g/dL (ref 13.0–17.0)
LYMPHS ABS: 2.4 10*3/uL (ref 0.7–4.0)
Lymphocytes Relative: 46 % (ref 12–46)
MCH: 29.2 pg (ref 26.0–34.0)
MCHC: 34.2 g/dL (ref 30.0–36.0)
MCV: 85.4 fL (ref 78.0–100.0)
MONO ABS: 0.3 10*3/uL (ref 0.1–1.0)
MONOS PCT: 5 % (ref 3–12)
MPV: 10.6 fL (ref 8.6–12.4)
Neutro Abs: 2.4 10*3/uL (ref 1.7–7.7)
Neutrophils Relative %: 47 % (ref 43–77)
PLATELETS: 239 10*3/uL (ref 150–400)
RBC: 5.27 MIL/uL (ref 4.22–5.81)
RDW: 13.1 % (ref 11.5–15.5)
WBC: 5.2 10*3/uL (ref 4.0–10.5)

## 2015-06-13 NOTE — Addendum Note (Signed)
Addended by: Mariea ClontsGREEN, Seara Hinesley D on: 06/13/2015 10:32 AM   Modules accepted: Orders

## 2015-06-14 LAB — T-HELPER CELL (CD4) - (RCID CLINIC ONLY)
CD4 % Helper T Cell: 27 % — ABNORMAL LOW (ref 33–55)
CD4 T CELL ABS: 680 /uL (ref 400–2700)

## 2015-06-14 LAB — URINE CYTOLOGY ANCILLARY ONLY
Chlamydia: NEGATIVE
Neisseria Gonorrhea: NEGATIVE

## 2015-06-14 LAB — HIV-1 RNA QUANT-NO REFLEX-BLD: HIV 1 RNA Quant: <20 copies/mL (ref <20)

## 2015-06-14 LAB — RPR

## 2015-06-27 ENCOUNTER — Ambulatory Visit (INDEPENDENT_AMBULATORY_CARE_PROVIDER_SITE_OTHER): Payer: 59 | Admitting: Infectious Disease

## 2015-06-27 ENCOUNTER — Encounter: Payer: Self-pay | Admitting: Infectious Disease

## 2015-06-27 VITALS — BP 131/80 | HR 92 | Temp 98.0°F | Wt 174.8 lb

## 2015-06-27 DIAGNOSIS — B20 Human immunodeficiency virus [HIV] disease: Secondary | ICD-10-CM

## 2015-06-27 DIAGNOSIS — I10 Essential (primary) hypertension: Secondary | ICD-10-CM | POA: Diagnosis not present

## 2015-06-27 MED ORDER — AMLODIPINE BESYLATE 5 MG PO TABS: 5.0000 mg | ORAL_TABLET | Freq: Every day | ORAL | Status: DC

## 2015-06-27 MED ORDER — EMTRICITAB-RILPIVIR-TENOFOV AF 200-25-25 MG PO TABS: 1.0000 | ORAL_TABLET | Freq: Every day | ORAL | Status: DC

## 2015-06-27 NOTE — Assessment & Plan Note (Addendum)
Assessment: Doing very well on Odefsy with recent CD4 680, VL <20.  He has had very good viral suppression for > 5 years and taking his medication regularly.  He takes medications daily with no missed doses.  No new sexual partners.  Plan: - continue Odefsy - RTC 1 year

## 2015-06-27 NOTE — Assessment & Plan Note (Signed)
Pt compliant with amlodipine 5mg  daily, well controlled BP today of 131/80.

## 2015-06-27 NOTE — Progress Notes (Signed)
Patient Active Problem List   Diagnosis Date Noted  . Burping 02/23/2015  . Hyperbilirubinemia 07/26/2014  . HIV disease (HCC)   . Condyloma   . URI (upper respiratory infection) 05/24/2011  . Routine health maintenance 05/24/2011  . HTN (hypertension) 01/19/2011  . 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 Human immunodeficiency virus (HIV) disease (HCC) 01/31/2010    Patient's Medications  New Prescriptions   No medications on file  Previous Medications   No medications on file  Modified Medications   Modified Medication Previous Medication   AMLODIPINE (NORVASC) 5 MG TABLET amLODipine (NORVASC) 5 MG tablet      Take 1 tablet (5 mg total) by mouth daily.    Take 5 mg by mouth daily.   EMTRICITABINE-RILPIVIR-TENOFOVIR AF (ODEFSEY) 200-25-25 MG TABS TABLET Emtricitab-Rilpivir-Tenofov AF (ODEFSEY) 200-25-25 MG TABS      Take 1 tablet by mouth daily with lunch.    Take 1 tablet by mouth daily with lunch.  Discontinued Medications   No medications on file    Subjective: Mr. Uzelac is doing well on Odefsy.  His most recent CD4 is 680, VL < 20.  He takes his medication daily at lunch with a chewable meal.  He has not missed any doses in last 30 days.  He has no complaints of side effects.  No new sexual partners.  His recent onset of belching resolved a couple weeks after his last visit.  Review of Systems: Review of Systems  Constitutional: Negative for fever and chills.  HENT: Negative for sore throat.   Respiratory: Negative for cough.   Cardiovascular: Negative for chest pain.  Gastrointestinal: Negative for nausea, vomiting and abdominal pain.  Musculoskeletal: Negative for myalgias.  Skin: Negative for rash.  Neurological: Negative for headaches.    Past Medical History  Diagnosis Date  . HIV disease (HCC)   . HTN (hypertension)   . Condyloma   . Hyperbilirubinemia 07/26/2014  . Burping 02/23/2015    Social History  Substance Use Topics  .  Smoking status: Never Smoker   . Smokeless tobacco: Never Used  . Alcohol Use: 1.5 oz/week    3 drink(s) per week    Family History  Problem Relation Age of Onset  . Hypertension Mother   . Diabetes Father   . Cancer Maternal Grandfather   . Alzheimer's disease Paternal Grandmother   . Diabetes Paternal Grandfather     No Known Allergies  Objective:  Filed Vitals:   06/27/15 0846  BP: 131/80  Pulse: 92  Temp: 98 F (36.7 C)  TempSrc: Oral  Weight: 174 lb 12.8 oz (79.289 kg)   Body mass index is 26.58 kg/(m^2).  Physical Exam  Constitutional: He is oriented to person, place, and time and well-developed, well-nourished, and in no distress.  HENT:  Head: Normocephalic and atraumatic.  Eyes: Conjunctivae and EOM are normal.  Pulmonary/Chest: Effort normal.  Musculoskeletal: Normal range of motion.  Neurological: He is alert and oriented to person, place, and time.  Skin: Skin is warm and dry.  Psychiatric: Mood and affect normal.    Lab Results Lab Results  Component Value Date   WBC 5.2 06/13/2015   HGB 15.4 06/13/2015   HCT 45.0 06/13/2015   MCV 85.4 06/13/2015   PLT 239 06/13/2015    Lab Results  Component Value Date   CREATININE 0.79 06/13/2015   BUN 11 06/13/2015   NA 140 06/13/2015   K 3.9 06/13/2015  CL 104 06/13/2015   CO2 26 06/13/2015    Lab Results  Component Value Date   ALT 26 06/13/2015   AST 25 06/13/2015   ALKPHOS 63 06/13/2015   BILITOT 1.0 06/13/2015    Lab Results  Component Value Date   CHOL 183 02/14/2015   HDL 52 02/14/2015   LDLCALC 120 02/14/2015   TRIG 56 02/14/2015   CHOLHDL 3.5 02/14/2015    Lab Results HIV 1 RNA QUANT (copies/mL)  Date Value  06/13/2015 <20  02/14/2015 <20  07/06/2014 <20   CD4 T CELL ABS (/uL)  Date Value  06/13/2015 680  02/14/2015 620  07/06/2014 500      Problem List Items Addressed This Visit      Cardiovascular and Mediastinum   HTN (hypertension) - Primary    Pt compliant  with amlodipine 5mg  daily, well controlled BP today of 131/80.      Relevant Medications   amLODipine (NORVASC) 5 MG tablet     Other   Human immunodeficiency virus (HIV) disease (HCC)    Assessment: Doing very well on Odefsy with recent CD4 680, VL <20.  He has had very good viral suppression for > 5 years and taking his medication regularly.  He takes medications daily with no missed doses.  No new sexual partners.  Plan: - continue Odefsy - RTC 1 year         Relevant Medications   emtricitabine-rilpivir-tenofovir AF (ODEFSEY) 200-25-25 MG TABS tablet        Gwynn BurlyAndrew Arrin Pintor, DO   06/27/2015, 10:08 AM        INFECTIOUS DISEASE ATTENDING ADDENDUM:   Date: 06/27/2015  Patient name: Derek KhanGregory C Digiulio Jr.  Medical record number: [id number]  Date of birth: [birth year]    This patient has been seen and discussed with the house staff. Please see the Dr. Philis PiqueWallace's note for complete details. I concur with their findings with the following additions/corrections:  Derek Fuentes continues to be perfectly adherent to his antiretroviral regimen. He is without any side effects and tolerating his ODEFSEY quite well. Blood pressure is also under good control. We will have him come follow-up with us in 1 years time. He does get influenza vaccinated as part of his job requirement [facility] at [employer]. I sent a one years worth of prescriptions for ODEFSEY and amlodipine to the [facility].  Acey LavCornelius Van Dam 06/27/2015, 12:29 PM

## 2015-08-18 ENCOUNTER — Other Ambulatory Visit: Payer: Self-pay | Admitting: *Deleted

## 2015-08-18 ENCOUNTER — Telehealth: Payer: Self-pay | Admitting: *Deleted

## 2015-08-18 DIAGNOSIS — B20 Human immunodeficiency virus [HIV] disease: Secondary | ICD-10-CM

## 2015-08-18 MED ORDER — EMTRICITAB-RILPIVIR-TENOFOV AF 200-25-25 MG PO TABS: 1.0000 | ORAL_TABLET | Freq: Every day | ORAL | Status: DC

## 2015-08-18 NOTE — Telephone Encounter (Signed)
PA initiated via CoverMyMeds for Odefsey.  Per pharmacy, patient does have active BCBS insurance through June 2017.   Andree CossHowell, Terin Dierolf M, RN

## 2015-08-18 NOTE — Telephone Encounter (Signed)
No authorization needed, medication is on formulary. Patient needs to use [employer], prescription sent and demographics updated as he is new to them. Andree CossHowell, Marquee Fuchs M, RN

## 2016-01-27 IMAGING — CR DG CHEST 2V
2 series · 2 of 2 positions shown · non-contrast
Comparison: None available

CLINICAL DATA: Cough

EXAM:
CHEST - 2 VIEW

[view not recorded (1 of 2)]
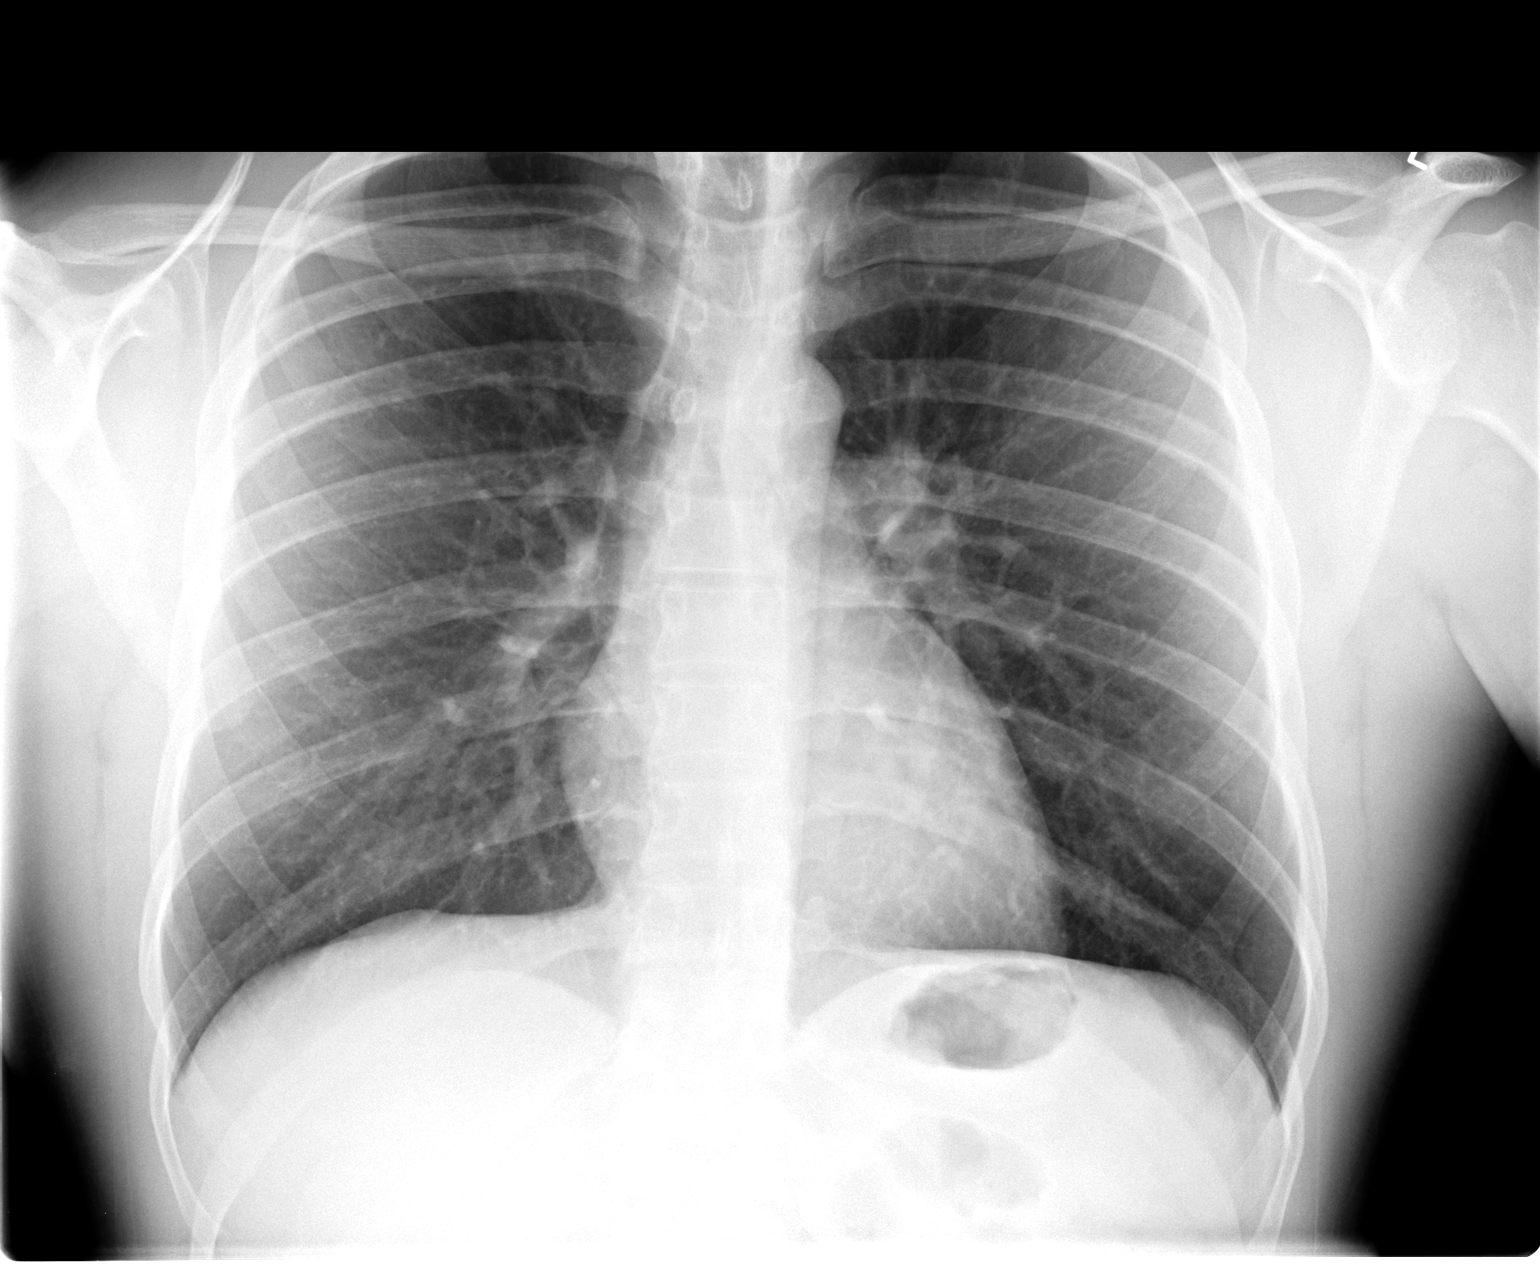

[view not recorded (2 of 2)]
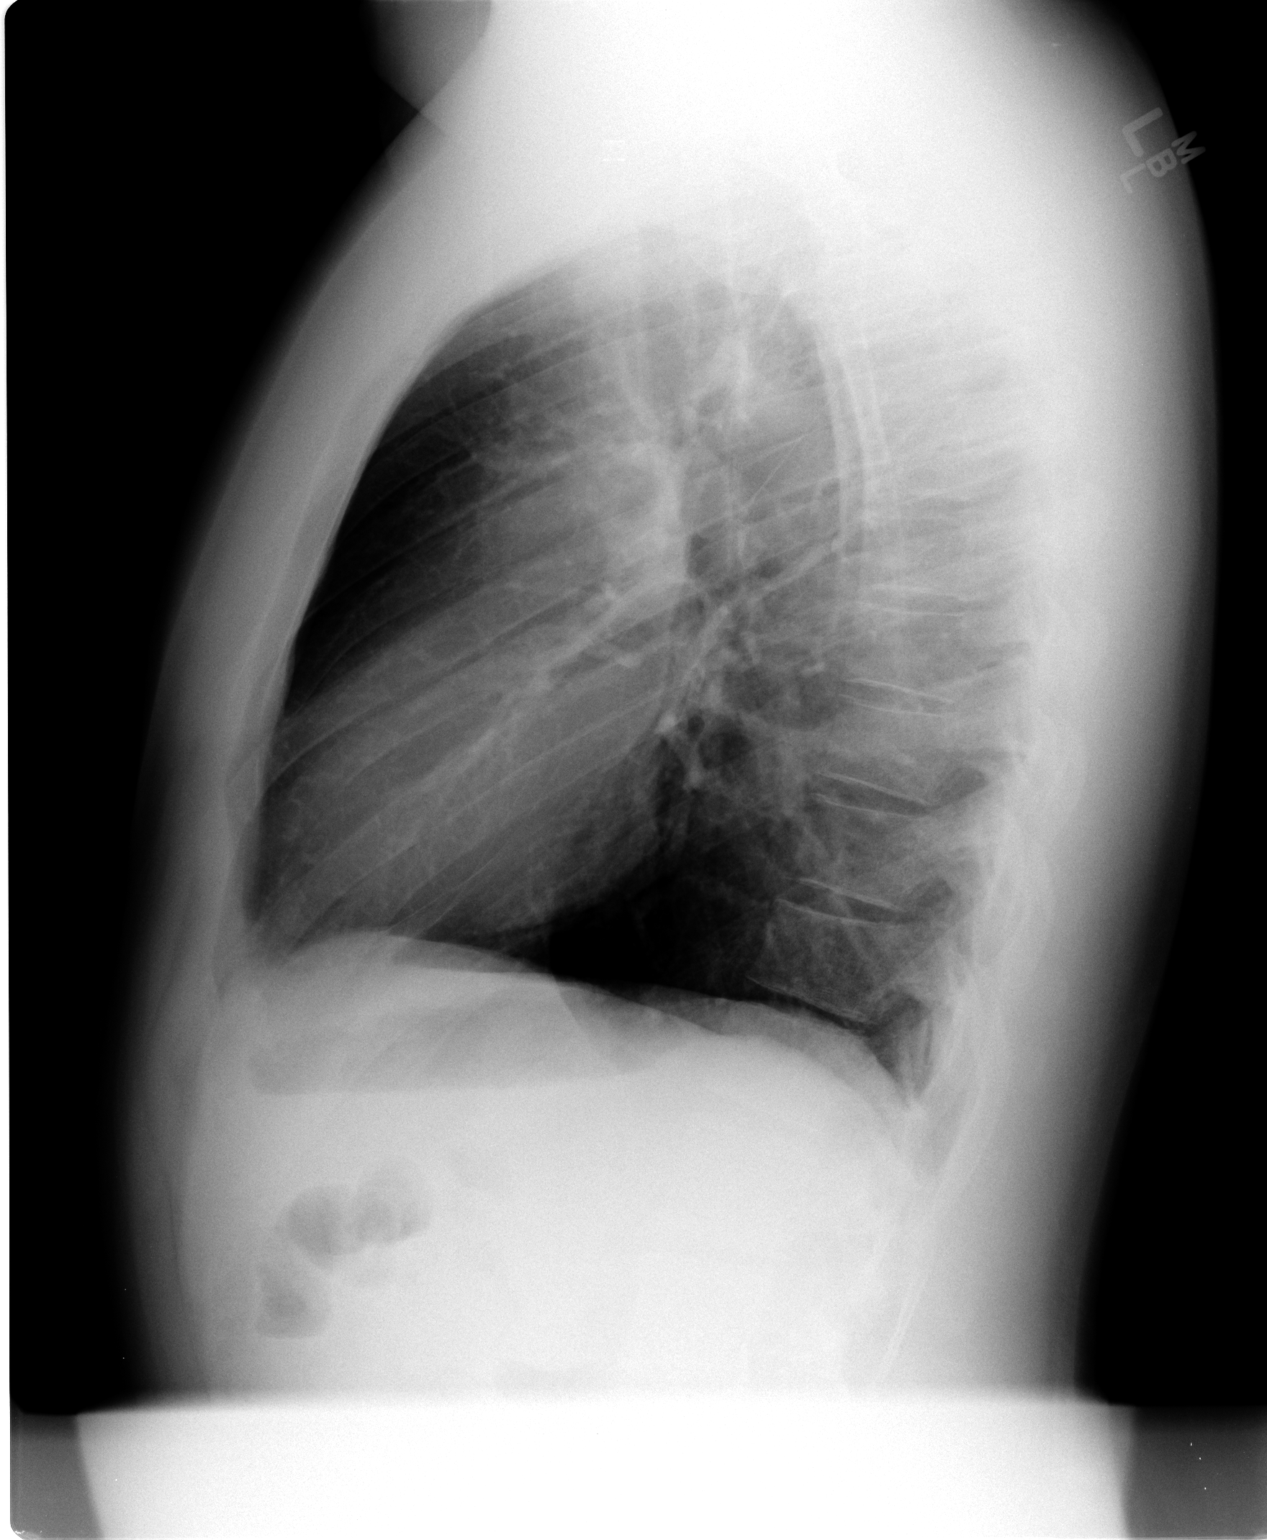

[2 of 2 positions shown; findings below may reference images not displayed]

FINDINGS: Lungs are clear. Heart size and mediastinal contours are within
normal limits.
No effusion.
Visualized skeletal structures are unremarkable.
IMPRESSION: No acute cardiopulmonary disease.

## 2016-06-15 ENCOUNTER — Other Ambulatory Visit (HOSPITAL_COMMUNITY)
Admission: RE | Admit: 2016-06-15 | Discharge: 2016-06-15 | Disposition: A | Discharge status: Home / Self Care | Payer: BLUE CROSS/BLUE SHIELD | Source: Ambulatory Visit | Attending: Infectious Disease | Admitting: Infectious Disease

## 2016-06-15 ENCOUNTER — Other Ambulatory Visit: Payer: BLUE CROSS/BLUE SHIELD

## 2016-06-15 DIAGNOSIS — Z113 Encounter for screening for infections with a predominantly sexual mode of transmission: Secondary | ICD-10-CM

## 2016-06-15 DIAGNOSIS — B20 Human immunodeficiency virus [HIV] disease: Secondary | ICD-10-CM

## 2016-06-15 LAB — COMPLETE METABOLIC PANEL WITH GFR
ALBUMIN: 4.9 g/dL (ref 3.6–5.1)
ALK PHOS: 67 U/L (ref 40–115)
ALT: 16 U/L (ref 9–46)
AST: 18 U/L (ref 10–40)
BILIRUBIN TOTAL: 1.5 mg/dL — AB (ref 0.2–1.2)
BUN: 11 mg/dL (ref 7–25)
CO2: 25 mmol/L (ref 20–31)
Calcium: 9.9 mg/dL (ref 8.6–10.3)
Chloride: 105 mmol/L (ref 98–110)
Creat: 0.99 mg/dL (ref 0.60–1.35)
GFR, Est African American: >89 mL/min (ref >=60)
GFR, Est Non African American: >89 mL/min (ref >=60)
GLUCOSE: 97 mg/dL (ref 65–99)
Potassium: 4.1 mmol/L (ref 3.5–5.3)
SODIUM: 139 mmol/L (ref 135–146)
TOTAL PROTEIN: 7.6 g/dL (ref 6.1–8.1)

## 2016-06-15 LAB — CBC WITH DIFFERENTIAL/PLATELET
BASOS ABS: 0 {cells}/uL (ref 0–200)
BASOS PCT: 0 %
EOS PCT: 1 %
Eosinophils Absolute: 51 cells/uL (ref 15–500)
HCT: 45.1 % (ref 38.5–50.0)
HEMOGLOBIN: 15.3 g/dL (ref 13.2–17.1)
LYMPHS ABS: 2193 {cells}/uL (ref 850–3900)
Lymphocytes Relative: 43 %
MCH: 29.1 pg (ref 27.0–33.0)
MCHC: 33.9 g/dL (ref 32.0–36.0)
MCV: 85.9 fL (ref 80.0–100.0)
MPV: 10.9 fL (ref 7.5–12.5)
Monocytes Absolute: 306 cells/uL (ref 200–950)
Monocytes Relative: 6 %
NEUTROS ABS: 2550 {cells}/uL (ref 1500–7800)
Neutrophils Relative %: 50 %
Platelets: 261 10*3/uL (ref 140–400)
RBC: 5.25 MIL/uL (ref 4.20–5.80)
RDW: 13.6 % (ref 11.0–15.0)
WBC: 5.1 10*3/uL (ref 3.8–10.8)

## 2016-06-15 LAB — T-HELPER CELL (CD4) - (RCID CLINIC ONLY)
CD4 T CELL HELPER: 27 % — AB (ref 33–55)
CD4 T Cell Abs: 640 /uL (ref 400–2700)

## 2016-06-16 LAB — RPR

## 2016-06-18 LAB — URINE CYTOLOGY ANCILLARY ONLY
Chlamydia: NEGATIVE
Neisseria Gonorrhea: NEGATIVE

## 2016-06-18 LAB — HIV-1 RNA QUANT-NO REFLEX-BLD
HIV 1 RNA Quant: <20 copies/mL
HIV-1 RNA Quant, Log: <1.3 Log copies/mL

## 2016-06-27 ENCOUNTER — Ambulatory Visit: Payer: 59 | Admitting: Infectious Disease

## 2016-08-02 ENCOUNTER — Other Ambulatory Visit: Payer: Self-pay | Admitting: Infectious Disease

## 2016-08-02 DIAGNOSIS — B20 Human immunodeficiency virus [HIV] disease: Secondary | ICD-10-CM

## 2016-08-13 ENCOUNTER — Encounter: Payer: Self-pay | Admitting: Infectious Disease

## 2016-08-13 ENCOUNTER — Ambulatory Visit: Payer: 59 | Admitting: Infectious Disease

## 2016-08-13 ENCOUNTER — Ambulatory Visit (INDEPENDENT_AMBULATORY_CARE_PROVIDER_SITE_OTHER): Payer: BLUE CROSS/BLUE SHIELD | Admitting: Infectious Disease

## 2016-08-13 VITALS — BP 142/85 | HR 78 | Temp 97.8°F | Ht 68.0 in | Wt 166.0 lb

## 2016-08-13 DIAGNOSIS — A630 Anogenital (venereal) warts: Secondary | ICD-10-CM

## 2016-08-13 DIAGNOSIS — B20 Human immunodeficiency virus [HIV] disease: Secondary | ICD-10-CM | POA: Diagnosis not present

## 2016-08-13 DIAGNOSIS — I10 Essential (primary) hypertension: Secondary | ICD-10-CM

## 2016-08-13 DIAGNOSIS — Z23 Encounter for immunization: Secondary | ICD-10-CM | POA: Diagnosis not present

## 2016-08-13 MED ORDER — EMTRICITAB-RILPIVIR-TENOFOV AF 200-25-25 MG PO TABS: 1.0000 | ORAL_TABLET | Freq: Every day | ORAL | 11 refills | Status: DC

## 2016-08-13 MED ORDER — AMLODIPINE BESYLATE 5 MG PO TABS: 5.0000 mg | ORAL_TABLET | Freq: Every day | ORAL | 11 refills | Status: DC

## 2016-08-13 NOTE — Addendum Note (Signed)
Addended by: Linnell FullingBRANNON, LATOYA N on: 08/13/2016 01:46 PM   Modules accepted: Orders

## 2016-08-13 NOTE — Patient Instructions (Signed)
We will make oneyear followup appt  If you can purchase an ambulatory Bp cufff and call and let us know what your bp is reading we can adjust your amlodipine  I think you should get established with Dr. Ninetta LightsHatcher for HRA (Dr Heywood BenePeter Leone also does these in [place])

## 2016-08-13 NOTE — Progress Notes (Signed)
Chief complaint: followup for HIV on meds   Subjective:    Patient ID: Derek KhanGregory C Duerson Fuentes., male    DOB: February 08, 1986, 31 y.o.   MRN: [id number]  HPI  Derek KhanGregory C Minar Fuentes is a 31  year old male who is doing superbly well on hisantiviral regimen, complera --> ODEFSEY with undetectable viral load and health cd4 count. Over last month he has noticed increased frequency of burping. He has not been taking any antacids or proton pump inhibitors for this.He does occasioanlly get heartburn when eating spicy or greasy foods but it passes.  He had genital warts int he past and saw surgeon but no recent HRA  Lab Results  Component Value Date   HIV1RNAQUANT <20 NOT DETECTED 06/15/2016   HIV1RNAQUANT <20 06/13/2015   HIV1RNAQUANT <20 02/14/2015     Lab Results  Component Value Date   CD4TABS 640 06/15/2016   CD4TABS 680 06/13/2015   CD4TABS 620 02/14/2015    Past Medical History:  Diagnosis Date  . Burping 02/23/2015  . Condyloma   . HIV disease (HCC)   . HTN (hypertension)   . Hyperbilirubinemia 07/26/2014    Past Surgical History:  Procedure Laterality Date  . wart removal      Family History  Problem Relation Age of Onset  . Hypertension Mother   . Diabetes Father   . Cancer Maternal Grandfather   . Alzheimer's disease Paternal Grandmother   . Diabetes Paternal Grandfather       Social History   Social History  . Marital status: Single    Spouse name: N/A  . Number of children: N/A  . Years of education: N/A   Social History Main Topics  . Smoking status: Never Smoker  . Smokeless tobacco: Never Used  . Alcohol use 1.5 oz/week    3 drink(s) per week  . Drug use: No  . Sexual activity: Yes   Other Topics Concern  . Not on file   Social History Narrative  . No narrative on file    No Known Allergies   Current Outpatient Prescriptions:  .  amLODipine (NORVASC) 5 MG tablet, Take 1 tablet (5 mg total) by mouth daily., Disp: 30 tablet, Rfl: 11 .   ODEFSEY 200-25-25 MG TABS tablet, TAKE 1 TABLET BY MOUTH DAILY WITH LUNCH, Disp: 30 tablet, Rfl: 0   Review of Systems  Constitutional: Negative for activity change, appetite change, chills, diaphoresis, fatigue, fever and unexpected weight change.  HENT: Negative for congestion, rhinorrhea, sinus pressure, sneezing, sore throat and trouble swallowing.   Eyes: Negative for photophobia and visual disturbance.  Respiratory: Negative for cough, chest tightness, shortness of breath, wheezing and stridor.   Cardiovascular: Negative for chest pain, palpitations and leg swelling.  Gastrointestinal: Negative for abdominal distention, abdominal pain, anal bleeding, blood in stool, constipation, diarrhea, nausea and vomiting.  Genitourinary: Negative for difficulty urinating, dysuria, flank pain and hematuria.  Musculoskeletal: Negative for arthralgias, back pain, gait problem, joint swelling and myalgias.  Skin: Negative for color change, pallor, rash and wound.  Neurological: Negative for dizziness, tremors, weakness and light-headedness.  Hematological: Negative for adenopathy. Does not bruise/bleed easily.  Psychiatric/Behavioral: Negative for agitation, behavioral problems, decreased concentration, dysphoric mood and sleep disturbance.       Objective:   Physical Exam  Constitutional: He is oriented to person, place, and time. He appears well-developed and well-nourished. No distress.  HENT:  Head: Normocephalic and atraumatic.  Mouth/Throat: Oropharynx is clear and moist. No oropharyngeal exudate.  Eyes: Conjunctivae and EOM are normal. Pupils are equal, round, and reactive to light. No scleral icterus.  Neck: Normal range of motion. Neck supple. No JVD present.  Cardiovascular: Normal rate and regular rhythm.   Pulmonary/Chest: Effort normal. No respiratory distress. He has no wheezes.  Abdominal: He exhibits no distension. There is no tenderness.  Musculoskeletal: He exhibits no edema or  tenderness.  Lymphadenopathy:    He has no cervical adenopathy.  Neurological: He is alert and oriented to person, place, and time. He has normal reflexes. He exhibits normal muscle tone. Coordination normal.  Skin: Skin is warm and dry. He is not diaphoretic. No erythema. No pallor.  Psychiatric: He has a normal mood and affect. His behavior is normal. Judgment and thought content normal.          Assessment & Plan:   HIV:  Continue Genvoya and consider change to BIKTARVY in the future  HTN: continue norvasc he did not take for past several days keep log and report back to [place] via epic There were no vitals filed for this visit.   Genital warts: refer to Dr. Ninetta Lights for HRA  I spent greater than 25 minutes with the patient including greater than 50% of time in face to face counsel of the patient re HIV, CD4 counts nature of immune system, genital warts, HTN andin coordination of  his care.

## 2016-12-17 ENCOUNTER — Other Ambulatory Visit: Payer: Self-pay | Admitting: *Deleted

## 2016-12-17 DIAGNOSIS — B20 Human immunodeficiency virus [HIV] disease: Secondary | ICD-10-CM

## 2016-12-17 MED ORDER — EMTRICITAB-RILPIVIR-TENOFOV AF 200-25-25 MG PO TABS: 1.0000 | ORAL_TABLET | Freq: Every day | ORAL | 0 refills | Status: DC

## 2016-12-17 NOTE — Progress Notes (Signed)
Patient called, stated his specialty mail order pharmacy unable to delivery before he will run out. He is asking for 30 day supply sent to local pharmacy of choice. Done. patient will follow up with local pharmacy. Andree Coss, RN

## 2017-03-14 ENCOUNTER — Other Ambulatory Visit: Payer: Self-pay | Admitting: Infectious Disease

## 2017-03-14 DIAGNOSIS — B20 Human immunodeficiency virus [HIV] disease: Secondary | ICD-10-CM

## 2017-04-17 ENCOUNTER — Other Ambulatory Visit: Payer: Self-pay

## 2017-04-17 DIAGNOSIS — B20 Human immunodeficiency virus [HIV] disease: Secondary | ICD-10-CM

## 2017-04-17 MED ORDER — EMTRICITAB-RILPIVIR-TENOFOV AF 200-25-25 MG PO TABS: 1.0000 | ORAL_TABLET | Freq: Every day | ORAL | 5 refills | Status: DC

## 2017-04-17 NOTE — Telephone Encounter (Signed)
Pharmacy calling : script faxed and returned without physician's signature.  Request for signed script.  Odefsey sent to [facility].   Laurell Josephsammy K King, RN

## 2017-04-18 ENCOUNTER — Telehealth: Payer: Self-pay

## 2017-04-18 NOTE — Telephone Encounter (Signed)
Patient called our office to say his prescription , Charlett LangoOdefsey has not been filled. Per Earl LitesGregory the pharmacy never received the script. I attempted to call the pharmacy and was not able to leave the script because they were not able to find his account.   Message left with patient to call the insurance company to set up account then call the office so we can submit the prescription.   12:19 Called Pharmacy [phone] with patient's ID number.  They received the medication and it is scheduled to go out on Friday.   Taylynn Easton K Tarynn Garling,RN

## 2017-07-10 NOTE — Addendum Note (Signed)
Addended by: Lurlean LeydenPOOLE, TRAVIS F on: 07/10/2017 10:23 AM   Modules accepted: Orders

## 2017-07-22 ENCOUNTER — Other Ambulatory Visit: Payer: BLUE CROSS/BLUE SHIELD

## 2017-07-22 ENCOUNTER — Other Ambulatory Visit (HOSPITAL_COMMUNITY)
Admission: RE | Admit: 2017-07-22 | Discharge: 2017-07-22 | Disposition: A | Discharge status: Home / Self Care | Payer: BLUE CROSS/BLUE SHIELD | Source: Ambulatory Visit | Attending: Infectious Disease | Admitting: Infectious Disease

## 2017-07-22 DIAGNOSIS — B20 Human immunodeficiency virus [HIV] disease: Secondary | ICD-10-CM

## 2017-07-23 LAB — LIPID PANEL
CHOL/HDL RATIO: 4 (calc) (ref <5.0)
CHOLESTEROL: 178 mg/dL (ref <200)
HDL: 44 mg/dL (ref >40)
LDL CHOLESTEROL (CALC): 117 mg/dL — AB
Non-HDL Cholesterol (Calc): 134 mg/dL (calc) — ABNORMAL HIGH (ref <130)
Triglycerides: 71 mg/dL (ref <150)

## 2017-07-23 LAB — CBC WITH DIFFERENTIAL/PLATELET
BASOS PCT: 0.4 %
Basophils Absolute: 20 cells/uL (ref 0–200)
Eosinophils Absolute: 112 cells/uL (ref 15–500)
Eosinophils Relative: 2.2 %
HCT: 44.8 % (ref 38.5–50.0)
HEMOGLOBIN: 15.5 g/dL (ref 13.2–17.1)
Lymphs Abs: 2208 cells/uL (ref 850–3900)
MCH: 29 pg (ref 27.0–33.0)
MCHC: 34.6 g/dL (ref 32.0–36.0)
MCV: 83.7 fL (ref 80.0–100.0)
MONOS PCT: 6.5 %
MPV: 11 fL (ref 7.5–12.5)
NEUTROS ABS: 2428 {cells}/uL (ref 1500–7800)
Neutrophils Relative %: 47.6 %
Platelets: 236 10*3/uL (ref 140–400)
RBC: 5.35 10*6/uL (ref 4.20–5.80)
RDW: 13 % (ref 11.0–15.0)
Total Lymphocyte: 43.3 %
WBC: 5.1 10*3/uL (ref 3.8–10.8)
WBCMIX: 332 {cells}/uL (ref 200–950)

## 2017-07-23 LAB — COMPLETE METABOLIC PANEL WITH GFR
AG Ratio: 1.8 (calc) (ref 1.0–2.5)
ALT: 17 U/L (ref 9–46)
AST: 20 U/L (ref 10–40)
Albumin: 5.1 g/dL (ref 3.6–5.1)
Alkaline phosphatase (APISO): 69 U/L (ref 40–115)
BUN: 16 mg/dL (ref 7–25)
CO2: 25 mmol/L (ref 20–32)
CREATININE: 1.02 mg/dL (ref 0.60–1.35)
Calcium: 10.3 mg/dL (ref 8.6–10.3)
Chloride: 105 mmol/L (ref 98–110)
GFR, Est African American: 114 mL/min/{1.73_m2} (ref >=60)
GFR, Est Non African American: 98 mL/min/{1.73_m2} (ref >=60)
GLUCOSE: 101 mg/dL — AB (ref 65–99)
Globulin: 2.8 g/dL (calc) (ref 1.9–3.7)
POTASSIUM: 4.3 mmol/L (ref 3.5–5.3)
Sodium: 140 mmol/L (ref 135–146)
Total Bilirubin: 1.3 mg/dL — ABNORMAL HIGH (ref 0.2–1.2)
Total Protein: 7.9 g/dL (ref 6.1–8.1)

## 2017-07-23 LAB — MICROALBUMIN / CREATININE URINE RATIO
Creatinine, Urine: 67 mg/dL (ref 20–320)
Microalb Creat Ratio: 6 mcg/mg creat (ref <30)
Microalb, Ur: 0.4 mg/dL

## 2017-07-23 LAB — URINE CYTOLOGY ANCILLARY ONLY
CHLAMYDIA, DNA PROBE: NEGATIVE
Neisseria Gonorrhea: NEGATIVE

## 2017-07-23 LAB — RPR: RPR Ser Ql: NONREACTIVE

## 2017-07-23 LAB — T-HELPER CELL (CD4) - (RCID CLINIC ONLY)
CD4 T CELL HELPER: 27 % — AB (ref 33–55)
CD4 T Cell Abs: 630 /uL (ref 400–2700)

## 2017-07-24 ENCOUNTER — Other Ambulatory Visit: Payer: BLUE CROSS/BLUE SHIELD

## 2017-07-24 DIAGNOSIS — B20 Human immunodeficiency virus [HIV] disease: Secondary | ICD-10-CM

## 2017-07-26 LAB — HIV-1 RNA QUANT-NO REFLEX-BLD
HIV 1 RNA QUANT: DETECTED {copies}/mL — AB
HIV-1 RNA QUANT, LOG: DETECTED {Log_copies}/mL — AB

## 2017-07-29 ENCOUNTER — Other Ambulatory Visit: Payer: Self-pay | Admitting: Infectious Disease

## 2017-08-05 ENCOUNTER — Ambulatory Visit: Payer: BLUE CROSS/BLUE SHIELD | Admitting: Infectious Disease

## 2017-08-07 ENCOUNTER — Ambulatory Visit: Payer: BLUE CROSS/BLUE SHIELD | Admitting: Infectious Disease

## 2017-08-07 ENCOUNTER — Encounter: Payer: Self-pay | Admitting: Infectious Disease

## 2017-08-07 VITALS — BP 155/86 | HR 76 | Temp 98.6°F | Wt 165.0 lb

## 2017-08-07 DIAGNOSIS — R739 Hyperglycemia, unspecified: Secondary | ICD-10-CM | POA: Diagnosis not present

## 2017-08-07 DIAGNOSIS — Z23 Encounter for immunization: Secondary | ICD-10-CM | POA: Diagnosis not present

## 2017-08-07 DIAGNOSIS — I10 Essential (primary) hypertension: Secondary | ICD-10-CM

## 2017-08-07 DIAGNOSIS — B20 Human immunodeficiency virus [HIV] disease: Secondary | ICD-10-CM | POA: Diagnosis not present

## 2017-08-07 HISTORY — DX: Hyperglycemia, unspecified: R73.9

## 2017-08-07 MED ORDER — BICTEGRAVIR-EMTRICITAB-TENOFOV 50-200-25 MG PO TABS: 1.0000 | ORAL_TABLET | Freq: Every day | ORAL | 11 refills | Status: DC

## 2017-08-07 NOTE — Addendum Note (Signed)
Addended by: Lurlean Leyden on: 08/07/2017 03:16 PM   Modules accepted: Orders

## 2017-08-07 NOTE — Progress Notes (Signed)
Chief complaint: followup for HIV on meds, he was also bothered by the fact that he had elevated blood sugar on testing of his metabolic panel   Subjective:    Patient ID: Derek Khan., male    DOB: 04-14-1985, 32 y.o.   MRN: [id number]  HPI  Derek Fuentes is a 32  year old male who is doing superbly well on hisantiviral regimen, complera --> ODEFSEY with undetectable viral load and health cd4 count. Over last month he has noticed increased frequency of burping. He has not been taking any antacids or proton pump inhibitors for this.He does occasioanlly get heartburn when eating spicy or greasy foods but it passes.  Today he expressed interest in changing to [facility] so that he would not have to have a food requirement I would have to worry about antiacids.  We will make this change and recheck his labs in the next 1 to 2 months.   Past Medical History:  Diagnosis Date  . Burping 02/23/2015  . Condyloma   . HIV disease (HCC)   . HTN (hypertension)   . Hyperbilirubinemia 07/26/2014    Past Surgical History:  Procedure Laterality Date  . wart removal      Family History  Problem Relation Age of Onset  . Hypertension Mother   . Diabetes Father   . Cancer Maternal Grandfather   . Alzheimer's disease Paternal Grandmother   . Diabetes Paternal Grandfather       Social History   Socioeconomic History  . Marital status: Single    Spouse name: Not on file  . Number of children: Not on file  . Years of education: Not on file  . Highest education level: Not on file  Occupational History  . Not on file  Social Needs  . Financial resource strain: Not on file  . Food insecurity:    Worry: Not on file    Inability: Not on file  . Transportation needs:    Medical: Not on file    Non-medical: Not on file  Tobacco Use  . Smoking status: Never Smoker  . Smokeless tobacco: Never Used  Substance and Sexual Activity  . Alcohol use: Yes    Alcohol/week: 1.5 oz    Types: 3 drink(s) per week  . Drug use: No  . Sexual activity: Yes  Lifestyle  . Physical activity:    Days per week: Not on file    Minutes per session: Not on file  . Stress: Not on file  Relationships  . Social connections:    Talks on phone: Not on file    Gets together: Not on file    Attends religious service: Not on file    Active member of club or organization: Not on file    Attends meetings of clubs or organizations: Not on file    Relationship status: Not on file  Other Topics Concern  . Not on file  Social History Narrative  . Not on file    No Known Allergies   Current Outpatient Medications:  .  amLODipine (NORVASC) 5 MG tablet, TAKE 1 TABLET BY MOUTH EVERY DAY, Disp: 30 tablet, Rfl: 10 .  emtricitabine-rilpivir-tenofovir AF (ODEFSEY) 200-25-25 MG TABS tablet, Take 1 tablet by mouth daily with breakfast., Disp: 30 tablet, Rfl: 5   Review of Systems  Constitutional: Negative for activity change, appetite change, chills, diaphoresis, fatigue, fever and unexpected weight change.  HENT: Negative for congestion, rhinorrhea, sinus pressure, sneezing, sore throat  and trouble swallowing.   Eyes: Negative for photophobia and visual disturbance.  Respiratory: Negative for cough, chest tightness, shortness of breath, wheezing and stridor.   Cardiovascular: Negative for chest pain, palpitations and leg swelling.  Gastrointestinal: Negative for abdominal distention, abdominal pain, anal bleeding, blood in stool, constipation, diarrhea, nausea and vomiting.  Genitourinary: Negative for difficulty urinating, dysuria, flank pain and hematuria.  Musculoskeletal: Negative for arthralgias, back pain, gait problem, joint swelling and myalgias.  Skin: Negative for color change, pallor, rash and wound.  Neurological: Negative for dizziness, tremors, weakness and light-headedness.  Hematological: Negative for adenopathy. Does not bruise/bleed easily.  Psychiatric/Behavioral:  Negative for agitation, behavioral problems, decreased concentration, dysphoric mood and sleep disturbance.       Objective:   Physical Exam  Constitutional: He is oriented to person, place, and time. He appears well-developed and well-nourished. No distress.  HENT:  Head: Normocephalic and atraumatic.  Mouth/Throat: Oropharynx is clear and moist. No oropharyngeal exudate.  Eyes: Pupils are equal, round, and reactive to light. Conjunctivae and EOM are normal. No scleral icterus.  Neck: Normal range of motion. Neck supple. No JVD present.  Cardiovascular: Normal rate and regular rhythm.  Pulmonary/Chest: Effort normal. No respiratory distress. He has no wheezes.  Abdominal: He exhibits no distension. There is no tenderness.  Musculoskeletal: He exhibits no edema or tenderness.  Lymphadenopathy:    He has no cervical adenopathy.  Neurological: He is alert and oriented to person, place, and time. He has normal reflexes. He exhibits normal muscle tone. Coordination normal.  Skin: Skin is warm and dry. He is not diaphoretic. No erythema. No pallor.  Psychiatric: He has a normal mood and affect. His behavior is normal. Judgment and thought content normal.          Assessment & Plan:   HIV: I will change him to [facility] with or without food.  I think he will be unlikely to have much in the way side effects I did caution him about some of the side effects that are seen with integrase strand transfer inhibitors that sometimes can be problematic for some patients including reported greater weight gain with these agents, occasional headaches and insomnia.  I doubt he will have the side effects but I want to make sure he was fully informed upon making the switch.  HTN: He has blood pressure was elevated again in the clinic I have asked him to monitor it at home and give me data back through my chart Vitals:   08/07/17 0917  BP: (!) 155/86  Pulse: 76  Temp: 98.6 F (37 C)     Genital  warts: Still does needs to see  Dr. Ninetta Lights for HRA  Data blood sugar: I expect it was because he ate that morning we will have him check a fasting metabolic panel when he comes back for repeat  labs  I spent greater than 25 minutes with the patient including greater than 50% of time in face to face counsel of the patient during his new antiretroviral medication and the difference between this and his prior regimen, other options for treatment and in coordination of his care.

## 2017-09-09 ENCOUNTER — Encounter: Payer: Self-pay | Admitting: Infectious Disease

## 2017-09-10 NOTE — Telephone Encounter (Signed)
Left Earl LitesGregory a message, asked him to call back.  Gave his name/number to Con MemosMichelle Evans to contact for medication assistance.  Andree CossHowell, Kala Ambriz M, RN

## 2017-09-11 ENCOUNTER — Other Ambulatory Visit: Payer: Self-pay

## 2017-09-11 MED ORDER — BICTEGRAVIR-EMTRICITAB-TENOFOV 50-200-25 MG PO TABS: 1.0000 | ORAL_TABLET | Freq: Every day | ORAL | 11 refills | Status: DC

## 2017-09-24 ENCOUNTER — Other Ambulatory Visit: Payer: Self-pay | Admitting: *Deleted

## 2017-09-24 DIAGNOSIS — B20 Human immunodeficiency virus [HIV] disease: Secondary | ICD-10-CM

## 2017-09-24 MED ORDER — BICTEGRAVIR-EMTRICITAB-TENOFOV 50-200-25 MG PO TABS: 1.0000 | ORAL_TABLET | Freq: Every day | ORAL | 5 refills | Status: DC

## 2017-09-25 ENCOUNTER — Encounter: Payer: Self-pay | Admitting: Infectious Disease

## 2017-10-10 ENCOUNTER — Telehealth: Payer: Self-pay

## 2017-10-10 NOTE — Telephone Encounter (Signed)
PT called today stating he had some insurance changes and would like to know where he should pick up his medication from. Advised pt to call the number behind his insurance card to see what pharmacy the insurance prefers. PT also stated that insurance only covers ID office in [place] and would like a referral to be sent to transfer point of care. Will inform MD that pt would [facility]. Lorenso CourierJose L Maldonado, [place]

## 2017-10-11 NOTE — Telephone Encounter (Signed)
Ok very good.

## 2017-10-14 ENCOUNTER — Other Ambulatory Visit: Payer: Self-pay | Admitting: Behavioral Health

## 2017-10-14 ENCOUNTER — Encounter: Payer: Self-pay | Admitting: Infectious Disease

## 2017-10-14 DIAGNOSIS — B20 Human immunodeficiency virus [HIV] disease: Secondary | ICD-10-CM

## 2017-10-14 MED ORDER — BICTEGRAVIR-EMTRICITAB-TENOFOV 50-200-25 MG PO TABS: 1.0000 | ORAL_TABLET | Freq: Every day | ORAL | 0 refills | Status: DC

## 2017-10-15 ENCOUNTER — Other Ambulatory Visit: Payer: Self-pay | Admitting: *Deleted

## 2017-10-15 DIAGNOSIS — B20 Human immunodeficiency virus [HIV] disease: Secondary | ICD-10-CM

## 2017-10-15 MED ORDER — BICTEGRAVIR-EMTRICITAB-TENOFOV 50-200-25 MG PO TABS: 1.0000 | ORAL_TABLET | Freq: Every day | ORAL | 1 refills | Status: DC

## 2017-10-24 ENCOUNTER — Encounter: Payer: Self-pay | Admitting: Infectious Disease

## 2017-10-25 ENCOUNTER — Other Ambulatory Visit: Payer: Self-pay | Admitting: Infectious Disease

## 2017-10-25 DIAGNOSIS — B20 Human immunodeficiency virus [HIV] disease: Secondary | ICD-10-CM

## 2017-10-25 NOTE — Telephone Encounter (Signed)
[facility] due to [facility] stipulations. Derek CossHowell, Derek Carlini M, RN

## 2018-08-08 ENCOUNTER — Other Ambulatory Visit: Payer: Self-pay

## 2018-08-08 NOTE — Telephone Encounter (Signed)
Biktarvy script denied.  Patient transferred to [facility], RN
